# Patient Record
Sex: Female | Born: 1990 | Race: Asian | Hispanic: No | Marital: Single | Smoking: Never smoker
Health system: Southern US, Community
[De-identification: ages and names within clinical notes are randomized; demographics above are authoritative.]

## PROBLEM LIST (undated history)

## (undated) DIAGNOSIS — R7303 Prediabetes: Secondary | ICD-10-CM

## (undated) DIAGNOSIS — N939 Abnormal uterine and vaginal bleeding, unspecified: Secondary | ICD-10-CM

## (undated) DIAGNOSIS — E079 Disorder of thyroid, unspecified: Secondary | ICD-10-CM

## (undated) HISTORY — DX: Prediabetes: R73.03

## (undated) HISTORY — DX: Abnormal uterine and vaginal bleeding, unspecified: N93.9

## (undated) HISTORY — DX: Disorder of thyroid, unspecified: E07.9

---

## 2015-03-02 ENCOUNTER — Other Ambulatory Visit: Payer: Self-pay | Admitting: Obstetrics & Gynecology

## 2015-03-03 ENCOUNTER — Ambulatory Visit
Admission: RE | Admit: 2015-03-03 | Discharge: 2015-03-03 | Disposition: A | Discharge status: Home / Self Care | Payer: No Typology Code available for payment source | Source: Ambulatory Visit | Attending: Obstetrics & Gynecology | Admitting: Obstetrics & Gynecology

## 2015-03-03 ENCOUNTER — Other Ambulatory Visit: Payer: Self-pay | Admitting: Obstetrics & Gynecology

## 2015-03-03 DIAGNOSIS — Z9289 Personal history of other medical treatment: Secondary | ICD-10-CM

## 2015-03-03 DIAGNOSIS — R7611 Nonspecific reaction to tuberculin skin test without active tuberculosis: Secondary | ICD-10-CM | POA: Diagnosis present

## 2015-03-04 ENCOUNTER — Other Ambulatory Visit: Payer: Self-pay | Admitting: Obstetrics & Gynecology

## 2015-03-04 DIAGNOSIS — Z9289 Personal history of other medical treatment: Secondary | ICD-10-CM

## 2016-10-07 IMAGING — CR DG CHEST 2V
1 series · 2 of 2 positions shown · non-contrast
Comparison: None.

CLINICAL DATA: History of positive PPD

EXAM:
CHEST  2 VIEW

[Series 1: dg chest 2 view · 0.14mm/px · 2 of 2 slices shown]
[im 1/2]
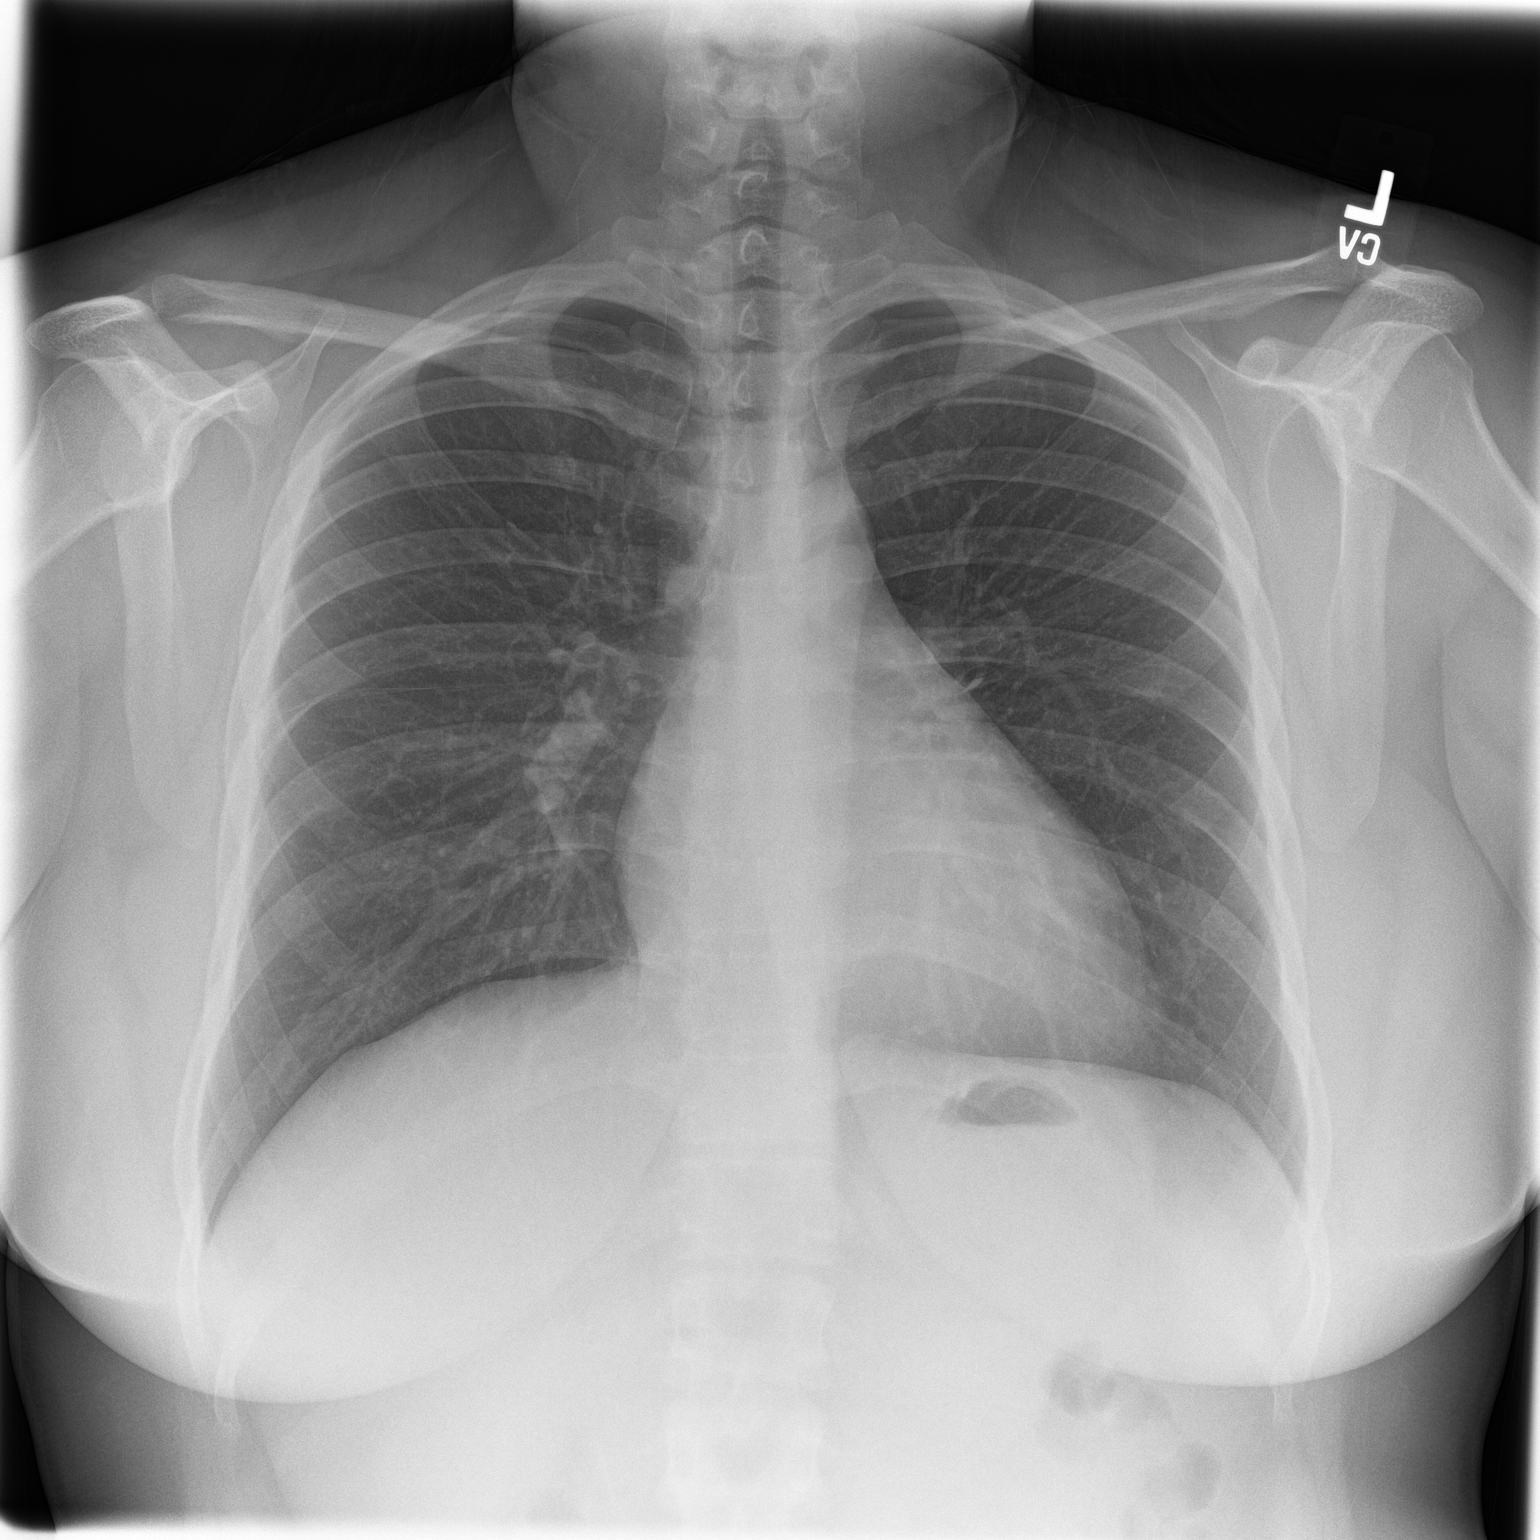
[im 2/2]
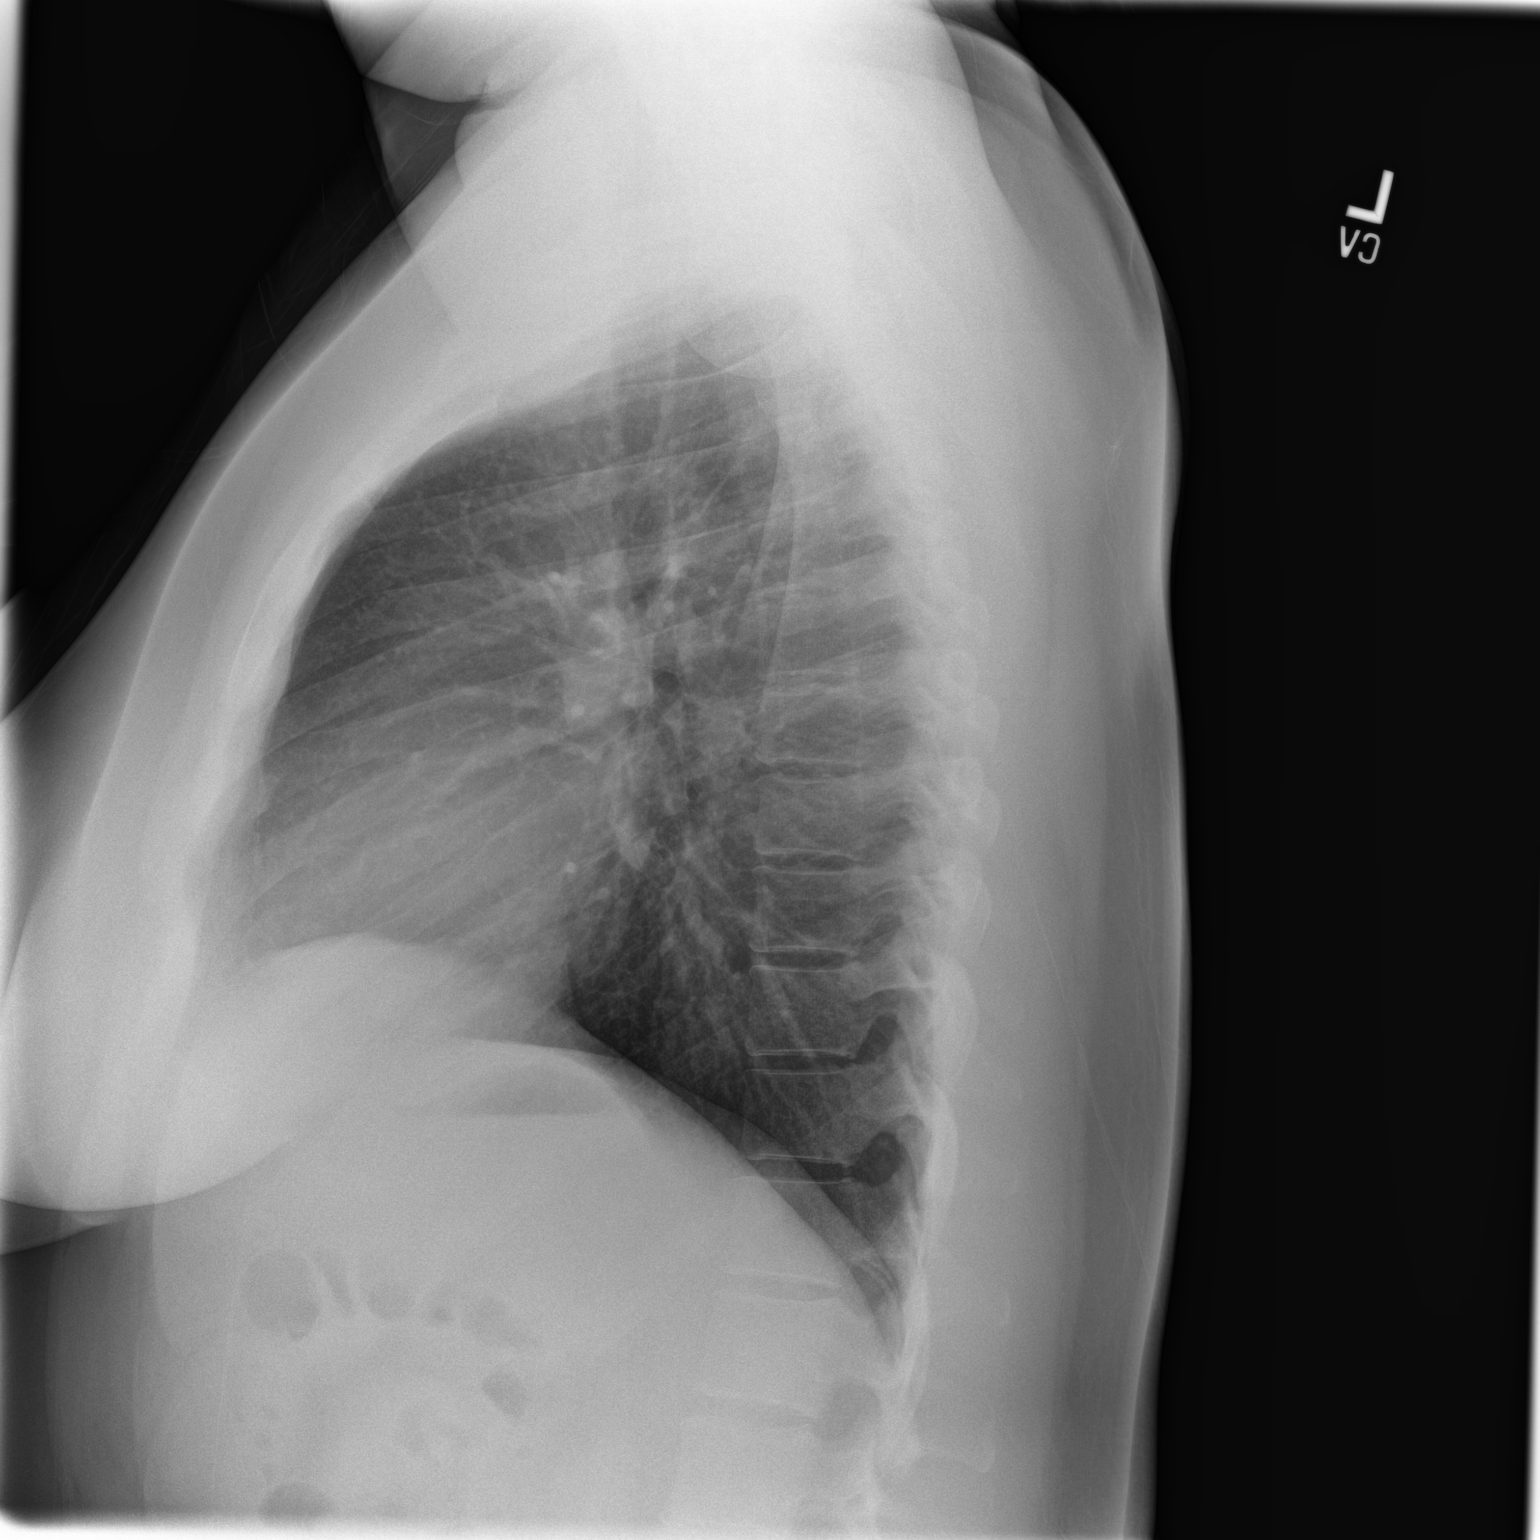

[2 of 2 positions shown; findings below may reference images not displayed]

FINDINGS: The heart size and mediastinal contours are within normal limits.
Both lungs are clear. The visualized skeletal structures are
unremarkable.
IMPRESSION: No active cardiopulmonary disease.

## 2016-10-25 ENCOUNTER — Encounter: Payer: Self-pay | Admitting: Obstetrics and Gynecology

## 2016-11-09 ENCOUNTER — Ambulatory Visit (INDEPENDENT_AMBULATORY_CARE_PROVIDER_SITE_OTHER): Payer: 59 | Admitting: Obstetrics and Gynecology

## 2016-11-09 ENCOUNTER — Encounter: Payer: Self-pay | Admitting: Obstetrics and Gynecology

## 2016-11-09 VITALS — BP 120/70 | HR 76 | Resp 16 | Ht 64.5 in | Wt 202.0 lb

## 2016-11-09 DIAGNOSIS — Z01419 Encounter for gynecological examination (general) (routine) without abnormal findings: Secondary | ICD-10-CM | POA: Diagnosis not present

## 2016-11-09 DIAGNOSIS — R7303 Prediabetes: Secondary | ICD-10-CM

## 2016-11-09 DIAGNOSIS — Z30431 Encounter for routine checking of intrauterine contraceptive device: Secondary | ICD-10-CM

## 2016-11-09 DIAGNOSIS — Z0000 Encounter for general adult medical examination without abnormal findings: Secondary | ICD-10-CM

## 2016-11-09 DIAGNOSIS — E039 Hypothyroidism, unspecified: Secondary | ICD-10-CM | POA: Diagnosis not present

## 2016-11-09 DIAGNOSIS — N939 Abnormal uterine and vaginal bleeding, unspecified: Secondary | ICD-10-CM | POA: Diagnosis not present

## 2016-11-09 NOTE — Progress Notes (Signed)
26 y.o. G0P0000 SingleAsianF here for annual exam.  She had a mirena IUD placed in 4/17. The patient had evaluation for irregular cycles in 2016 and was diagnosed with hypothyroidism. She was treated with synthroid, then she was having cycles every 2 weeks with heavy flow.  With the IUD she does have a cycle every month, changes a tampon in 3 hours (not always saturated). She can spot for 3 weeks at a time, when she cycles she won't bleed past 7 days.  [place] and she used the nuvaring for a few months, stopped it in May. Cramps are mild to none.  Not currently sexually active, has been in the past. She is on synthroid. She has lost about 10 lbs in the last 2 years. Not  Period Cycle (Days): 21 Period Duration (Days): unsure  Period Pattern: (!) Irregular Menstrual Flow: Light, Heavy Menstrual Control: Tampon Menstrual Control Change Freq (Hours): 4-24 Dysmenorrhea: (!) Mild Dysmenorrhea Symptoms: Other (Comment) (dizziness )  Patient's last menstrual period was 10/22/2016.          Sexually active: No.  The current method of family planning is IUD and abstinence.    Exercising: Yes.    swimming Smoker:  no  Health Maintenance: Pap:  11/24/14 Neg  History of abnormal Pap:  no MMG:  Never TDaP:  2008 Gardasil: completed    reports that she has never smoked. She has never used smokeless tobacco. She reports that she does not drink alcohol or use drugs.She just graduated from [employer], working at [employer]. She is from [place].   Past Medical History:  Diagnosis Date  . Abnormal uterine bleeding   . Thyroid disease    Hypo    History reviewed. No pertinent surgical history.  Current Outpatient Prescriptions  Medication Sig Dispense Refill  . levonorgestrel (MIRENA) 20 MCG/24HR IUD 1 each by Intrauterine route once. Placed 07/23/15    . levothyroxine (SYNTHROID, LEVOTHROID) 50 MCG tablet Take 1 tablet by mouth daily.     No current  facility-administered medications for this visit.     Family History  Problem Relation Age of Onset  . Breast cancer Maternal Aunt   . Cancer Maternal Grandfather   . Heart disease Paternal Grandfather   MAunt PMP with breast cancer  Review of Systems  Constitutional: Negative.   HENT: Negative.   Eyes: Negative.   Respiratory: Negative.   Cardiovascular: Negative.   Gastrointestinal: Negative.   Endocrine: Negative.   Genitourinary: Negative.   Musculoskeletal: Negative.   Skin: Negative.   Allergic/Immunologic: Negative.   Neurological: Negative.   Hematological: Negative.   Psychiatric/Behavioral: Negative.     Exam:   BP 120/70 (BP Location: Right Arm, Patient Position: Sitting, Cuff Size: Large)   Pulse 76   Resp 16   Ht 5' 4.5" (1.638 m)   Wt 202 lb (91.6 kg)   LMP 10/22/2016   BMI 34.14 kg/m   Weight change: @WEIGHTCHANGE @ Height:   Height: 5' 4.5" (163.8 cm)  Ht Readings from Last 3 Encounters:  11/09/16 5' 4.5" (1.638 m)    General appearance: alert, cooperative and appears stated age Head: Normocephalic, without obvious abnormality, atraumatic Neck: no adenopathy, supple, symmetrical, trachea midline and thyroid normal to inspection and palpation Lungs: clear to auscultation bilaterally Cardiovascular: regular rate and rhythm Breasts: normal appearance, no masses or tenderness Abdomen: soft, non-tender; bowel sounds normal; no masses,  no organomegaly Extremities: extremities normal, atraumatic, no cyanosis or edema  Skin: Skin color, texture, turgor normal. No rashes or lesions Lymph nodes: Cervical, supraclavicular, and axillary nodes normal. No abnormal inguinal nodes palpated Neurologic: Grossly normal   Pelvic: External genitalia:  no lesions              Urethra:  normal appearing urethra with no masses, tenderness or lesions              Bartholins and Skenes: normal                 Vagina: normal appearing vagina with normal color and  discharge, no lesions              Cervix: no lesions and IUD string 5 cm (patient states she was told it was 4 cm)               Bimanual Exam:  Uterus:  normal size, contour, position, consistency, mobility, non-tender              Adnexa: no mass, fullness, tenderness               Rectovaginal: Confirms               Anus:  normal sphincter tone, no lesions  Chaperone was present for exam.  A:  Well Woman with normal exam  Irregular bleeding with the Mirena, she will call if she is having constant spotting.   She had very abnormal bleeding prior to the mirena, but was just diagnosed with and started on Synthroid  Not currently sexually active  Hypothyroidism  Pre-diabetes    P:   Pap due next year  Screening labs  TSH, HgbA1C  Continue with the IUD for now  We discussed that if we pulled the IUD and her cycles were worse, her 2 best options would be another mirena, or OCP's/nuvaring (not good at remembering to take pills)  Discussed breast self awareness  Discussed calcium and vit D intake  If her TSH is normal, I will call in her synthroid, if not she will need to f/u with primary MD  Name of primary given

## 2016-11-09 NOTE — Patient Instructions (Signed)
EXERCISE AND DIET:  We recommended that you start or continue a regular exercise program for good health. Regular exercise means any activity that makes your heart beat faster and makes you sweat.  We recommend exercising at least 30 minutes per day at least 3 days a week, preferably 4 or 5.  We also recommend a diet low in fat and sugar.  Inactivity, poor dietary choices and obesity can cause diabetes, heart attack, stroke, and kidney damage, among others.    ALCOHOL AND SMOKING:  Women should limit their alcohol intake to no more than 7 drinks/beers/glasses of wine (combined, not each!) per week. Moderation of alcohol intake to this level decreases your risk of breast cancer and liver damage. And of course, no recreational drugs are part of a healthy lifestyle.  And absolutely no smoking or even second hand smoke. Most people know smoking can cause heart and lung diseases, but did you know it also contributes to weakening of your bones? Aging of your skin?  Yellowing of your teeth and nails?  CALCIUM AND VITAMIN D:  Adequate intake of calcium and Vitamin D are recommended.  The recommendations for exact amounts of these supplements seem to change often, but generally speaking 600 mg of calcium (either carbonate or citrate) and 800 units of Vitamin D per day seems prudent. Certain women may benefit from higher intake of Vitamin D.  If you are among these women, your doctor will have told you during your visit.    PAP SMEARS:  Pap smears, to check for cervical cancer or precancers,  have traditionally been done yearly, although recent scientific advances have shown that most women can have pap smears less often.  However, every woman still should have a physical exam from her gynecologist every year. It will include a breast check, inspection of the vulva and vagina to check for abnormal growths or skin changes, a visual exam of the cervix, and then an exam to evaluate the size and shape of the uterus and  ovaries.  And after 28 years of age, a rectal exam is indicated to check for rectal cancers. We will also provide age appropriate advice regarding health maintenance, like when you should have certain vaccines, screening for sexually transmitted diseases, bone density testing, colonoscopy, mammograms, etc.   MAMMOGRAMS:  All women over 40 years old should have a yearly mammogram. Many facilities now offer a "3D" mammogram, which may cost around $50 extra out of pocket. If possible,  we recommend you accept the option to have the 3D mammogram performed.  It both reduces the number of women who will be called back for extra views which then turn out to be normal, and it is better than the routine mammogram at detecting truly abnormal areas.    COLONOSCOPY:  Colonoscopy to screen for colon cancer is recommended for all women at age 50.  We know, you hate the idea of the prep.  We agree, BUT, having colon cancer and not knowing it is worse!!  Colon cancer so often starts as a polyp that can be seen and removed at colonscopy, which can quite literally save your life!  And if your first colonoscopy is normal and you have no family history of colon cancer, most women don't have to have it again for 10 years.  Once every ten years, you can do something that may end up saving your life, right?  We will be happy to help you get it scheduled when you are ready.    Be sure to check your insurance coverage so you understand how much it will cost.  It may be covered as a preventative service at no cost, but you should check your particular policy.      Breast Self-Awareness Breast self-awareness means being familiar with how your breasts look and feel. It involves checking your breasts regularly and reporting any changes to your health care provider. Practicing breast self-awareness is important. A change in your breasts can be a sign of a serious medical problem. Being familiar with how your breasts look and feel allows  you to find any problems early, when treatment is more likely to be successful. All women should practice breast self-awareness, including women who have had breast implants. How to do a breast self-exam One way to learn what is normal for your breasts and whether your breasts are changing is to do a breast self-exam. To do a breast self-exam: Look for Changes  1. Remove all the clothing above your waist. 2. Stand in front of a mirror in a room with good lighting. 3. Put your hands on your hips. 4. Push your hands firmly downward. 5. Compare your breasts in the mirror. Look for differences between them (asymmetry), such as: ? Differences in shape. ? Differences in size. ? Puckers, dips, and bumps in one breast and not the other. 6. Look at each breast for changes in your skin, such as: ? Redness. ? Scaly areas. 7. Look for changes in your nipples, such as: ? Discharge. ? Bleeding. ? Dimpling. ? Redness. ? A change in position. Feel for Changes  Carefully feel your breasts for lumps and changes. It is best to do this while lying on your back on the floor and again while sitting or standing in the shower or tub with soapy water on your skin. Feel each breast in the following way:  Place the arm on the side of the breast you are examining above your head.  Feel your breast with the other hand.  Start in the nipple area and make  inch (2 cm) overlapping circles to feel your breast. Use the pads of your three middle fingers to do this. Apply light pressure, then medium pressure, then firm pressure. The light pressure will allow you to feel the tissue closest to the skin. The medium pressure will allow you to feel the tissue that is a little deeper. The firm pressure will allow you to feel the tissue close to the ribs.  Continue the overlapping circles, moving downward over the breast until you feel your ribs below your breast.  Move one finger-width toward the center of the body.  Continue to use the  inch (2 cm) overlapping circles to feel your breast as you move slowly up toward your collarbone.  Continue the up and down exam using all three pressures until you reach your armpit.  Write Down What You Find  Write down what is normal for each breast and any changes that you find. Keep a written record with breast changes or normal findings for each breast. By writing this information down, you do not need to depend only on memory for size, tenderness, or location. Write down where you are in your menstrual cycle, if you are still menstruating. If you are having trouble noticing differences in your breasts, do not get discouraged. With time you will become more familiar with the variations in your breasts and more comfortable with the exam. How often should I examine my breasts? Examine   your breasts every month. If you are breastfeeding, the best time to examine your breasts is after a feeding or after using a breast pump. If you menstruate, the best time to examine your breasts is 5-7 days after your period is over. During your period, your breasts are lumpier, and it may be more difficult to notice changes. When should I see my health care provider? See your health care provider if you notice:  A change in shape or size of your breasts or nipples.  A change in the skin of your breast or nipples, such as a reddened or scaly area.  Unusual discharge from your nipples.  A lump or thick area that was not there before.  Pain in your breasts.  Anything that concerns you.  This information is not intended to replace advice given to you by your health care provider. Make sure you discuss any questions you have with your health care provider. Document Released: 04/03/2005 Document Revised: 09/09/2015 Document Reviewed: 02/21/2015 Elsevier Interactive Patient Education  2018 Elsevier Inc.  

## 2016-11-10 ENCOUNTER — Encounter: Payer: Self-pay | Admitting: Obstetrics and Gynecology

## 2016-11-10 LAB — HEMOGLOBIN A1C
Est. average glucose Bld gHb Est-mCnc: 111 mg/dL
HEMOGLOBIN A1C: 5.5 % (ref 4.8–5.6)

## 2016-11-10 LAB — CBC
HEMATOCRIT: 41 % (ref 34.0–46.6)
Hemoglobin: 13.2 g/dL (ref 11.1–15.9)
MCH: 27.9 pg (ref 26.6–33.0)
MCHC: 32.2 g/dL (ref 31.5–35.7)
MCV: 87 fL (ref 79–97)
PLATELETS: 365 10*3/uL (ref 150–379)
RBC: 4.73 x10E6/uL (ref 3.77–5.28)
RDW: 13.9 % (ref 12.3–15.4)
WBC: 6.5 10*3/uL (ref 3.4–10.8)

## 2016-11-10 LAB — COMPREHENSIVE METABOLIC PANEL
ALBUMIN: 4.4 g/dL (ref 3.5–5.5)
ALK PHOS: 92 IU/L (ref 39–117)
ALT: 34 IU/L — AB (ref 0–32)
AST: 21 IU/L (ref 0–40)
Albumin/Globulin Ratio: 1.5 (ref 1.2–2.2)
BILIRUBIN TOTAL: 0.3 mg/dL (ref 0.0–1.2)
BUN/Creatinine Ratio: 16 (ref 9–23)
BUN: 12 mg/dL (ref 6–20)
CHLORIDE: 102 mmol/L (ref 96–106)
CO2: 23 mmol/L (ref 20–29)
CREATININE: 0.74 mg/dL (ref 0.57–1.00)
Calcium: 9.5 mg/dL (ref 8.7–10.2)
GFR calc Af Amer: 127 mL/min/{1.73_m2} (ref >59)
GFR calc non Af Amer: 111 mL/min/{1.73_m2} (ref >59)
GLUCOSE: 94 mg/dL (ref 65–99)
Globulin, Total: 3 g/dL (ref 1.5–4.5)
Potassium: 4.5 mmol/L (ref 3.5–5.2)
Sodium: 141 mmol/L (ref 134–144)
TOTAL PROTEIN: 7.4 g/dL (ref 6.0–8.5)

## 2016-11-10 LAB — LIPID PANEL
CHOL/HDL RATIO: 3.8 ratio (ref 0.0–4.4)
Cholesterol, Total: 199 mg/dL (ref 100–199)
HDL: 53 mg/dL (ref >39)
LDL CALC: 110 mg/dL — AB (ref 0–99)
TRIGLYCERIDES: 182 mg/dL — AB (ref 0–149)
VLDL Cholesterol Cal: 36 mg/dL (ref 5–40)

## 2016-11-10 LAB — TSH: TSH: 2.55 u[IU]/mL (ref 0.450–4.500)

## 2016-11-12 ENCOUNTER — Encounter: Payer: Self-pay | Admitting: Obstetrics and Gynecology

## 2016-11-14 ENCOUNTER — Telehealth: Payer: Self-pay | Admitting: *Deleted

## 2016-11-14 NOTE — Telephone Encounter (Signed)
Left message to call Cheryl LarssonJill at [phone].   From Cheryl Carr To Cheryl BolkJill Evelyn Jertson, Cheryl Carr Sent 11/12/2016 6:14 PM  Sorry for the multiple emails. I realized that Dr [name]'s note said she left 2-3cm of the string from the Mirena. Is there a possibility that my AUB could be because the Mirena moved? Thank you.     From Cheryl Carr To Cheryl BolkJill Evelyn Jertson, Cheryl Carr Sent 11/10/2016 3:08 PM  Cheryl Carr,   Thank you for seeing me yesterday. I am having regular cycle bleeding today. I didn't know if you'd recommend to wait it out, or possibly a nuvaring to see if it'll stop the bleeding? I also forgot to mention yesterday, I do get acne with my cycles, it'll go the length of my cycle. I understand acne may also be due to my IUD. Just wanted to get your opinion on if there is a way to decrease it.   Thank you, I appreciate it.   Cheryl Carr

## 2016-11-14 NOTE — Telephone Encounter (Signed)
See telephone encounter dated 11/14/16.

## 2016-11-14 NOTE — Telephone Encounter (Signed)
Spoke with patient. Reports cycle is still heavy and going now for 4 weeks.   1.Discussed OCP vs Nuvaring in addition to Mirena at last OV dated 11/09/16. Patient would like to know of this can be tried or wait?   2. Options for cyclic acne?  3. States Dr. Elesa MassedWard notes states 2-3 cm of string left from Mirena, Dr. Oscar LaJertson notes 5cm, could the IUD have moved?  Advised patient would review with Dr. Oscar LaJertson and return call with recommendations, patient is agreeable.  Dr. Oscar LaJertson -please advise?

## 2016-11-15 ENCOUNTER — Other Ambulatory Visit: Payer: Self-pay | Admitting: Obstetrics and Gynecology

## 2016-11-15 DIAGNOSIS — E785 Hyperlipidemia, unspecified: Secondary | ICD-10-CM

## 2016-11-15 DIAGNOSIS — R748 Abnormal levels of other serum enzymes: Secondary | ICD-10-CM

## 2016-11-15 MED ORDER — LEVOTHYROXINE SODIUM 50 MCG PO TABS
50.0000 ug | ORAL_TABLET | Freq: Every day | ORAL | 3 refills | Status: DC
Start: 2016-11-15 — End: 2018-09-11

## 2016-11-16 NOTE — Telephone Encounter (Signed)
Spoke with patient, advised as seen below per Dr. Oscar LaJertson.   1. Can I try Yaz for 1 month to see how cycle regulates? Is aware usually takes 3 months, unsure of how OCP regulates as a second contraceptive? Does not want to use OCP long term.  2. If IUD shows incorrect placement, are you agreeable to removing and replacing same day?   Advised patient will review with Dr. Oscar LaJertson and return call.  Dr. Oscar LaJertson, please advise?

## 2016-11-16 NOTE — Telephone Encounter (Signed)
The yaz is to regulate her bleeding for now and help her acne. She doesn't need to start it if she doesn't want to. I can definitely pull the IUD the same day as the ultrasound.  We would have to get her pre-certified to replace the IUD the same day as well. If that's what she wants to do, please have Suzy pre-certify (I can do that in the 30 min appointment slot) Another option is to pull the IUD and see how she does. Her bleeding got irregular when her thyroid was out of control and now it's normal.

## 2016-11-16 NOTE — Telephone Encounter (Signed)
We can start her on either OCP's or the nuvaring if she would like. Her ALT was just barely elevated, the nuvaring would be a good choice because it wouldn't affect her liver. Dianah FieldYaz would be better for acne, the yaz may slightly increase her risk of a blood clot over nuvaring or another OCP. Given the minimal increase in her ALT, I would be okay with the Yaz if she wants to try that. We can set her up for an ultrasound (the week of 8/14) to check for IUD placement to see if that is part of the problem, or we can pull the IUD if she prefers. Please set her up for either appointment.  I would recommend she either start the yaz or nuvaring today. She wasn't anemic last week, so I'm not worried about her from the perspective of the continued bleeding.  If there is any chance of pregnancy she should check a UPT.  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

## 2016-11-17 MED ORDER — DROSPIRENONE-ETHINYL ESTRADIOL 3-0.02 MG PO TABS: 1.0000 | ORAL_TABLET | Freq: Every day | ORAL | 2 refills | Status: DC

## 2016-11-17 NOTE — Telephone Encounter (Signed)
Left message to call Maryalyce Sanjuan at [phone].  

## 2016-11-17 NOTE — Telephone Encounter (Signed)
Spoke with patient, advised as seen below per Dr. Oscar LaJertson. Patient would like to start with Yaz first and monitor cycle. Patient aware to call with any questions/concerns. Verbalizes understanding and is agreeable.   Rx for Yaz #1/2RF to verified pharmacy on file.   Routing to provider for final review. Patient is agreeable to disposition. Will close encounter.

## 2016-11-17 NOTE — Telephone Encounter (Signed)
Patient returning your call.

## 2016-11-21 ENCOUNTER — Telehealth: Payer: Self-pay | Admitting: *Deleted

## 2016-11-21 DIAGNOSIS — N939 Abnormal uterine and vaginal bleeding, unspecified: Secondary | ICD-10-CM

## 2016-11-21 DIAGNOSIS — Z30431 Encounter for routine checking of intrauterine contraceptive device: Secondary | ICD-10-CM

## 2016-11-21 NOTE — Telephone Encounter (Signed)
[facility] that Dianah FieldYaz is not covered under patient's insurance plan until she has a trial of two NON-DROSPIRENONE containing oral contraceptives.  Please advise if change is appropriate. Thank you.  Routed to Dr. Edward JollySilva in Dr. Salli QuarryJertson's absence.

## 2016-11-22 NOTE — Telephone Encounter (Signed)
Triage, please initiate a prior authorization process for the Yaz.  This was specifically chosen for added benefit of treating acne.   Cc- Francee PiccoloStephanie Phillips

## 2016-11-23 NOTE — Telephone Encounter (Signed)
PA for Yaz submitted via [email or web address]

## 2016-11-27 ENCOUNTER — Encounter: Payer: Self-pay | Admitting: Obstetrics and Gynecology

## 2016-11-28 MED ORDER — NORGESTIM-ETH ESTRAD TRIPHASIC 0.18/0.215/0.25 MG-25 MCG PO TABS: 1.0000 | ORAL_TABLET | Freq: Every day | ORAL | 0 refills | Status: DC

## 2016-11-28 MED FILL — TRI-LO-MARZIA TABLET: 0.18/0.215/ | 84 days supply | Qty: 84 | Fill #0

## 2016-11-28 NOTE — Telephone Encounter (Signed)
Medication refill request: OCP change  Last AEX:  11/09/16 JJ Next AEX: none  Last MMG (if hormonal medication request): none Refill authorized: yaz sent 11/17/16 #1pack/2R.   Today please advise.

## 2016-11-28 NOTE — Telephone Encounter (Signed)
She has tried the nuvaring, have her try ortho-tri cyclen lo for 3 months. Please also set her up for an ultrasound to check her IUD. Her string was longer than expected at her last exam. We are trying to stop the irregular bleeding with the IUD and need to make sure it is positioned properly.

## 2016-11-28 NOTE — Telephone Encounter (Signed)
Spoke with patient. Advised of message as seen below from Dr.Jertson. Patient verbalizes understanding. Rx for Ortho Tri Cyclen Lo take 1 tablet daily #3 0RF sent to pharmacy on file. PUS scheduled for 11/30/16 at 1 pm with 1:30 pm consult with Dr.Jertson. Order placed for precert.  Routing to provider for final review. Patient agreeable to disposition. Will close encounter.

## 2016-11-28 NOTE — Telephone Encounter (Signed)
PA received from [employer]. PA for Dianah FieldYaz has been denied. Per insurance the patient must try 2 no drospirenone containing oral contraceptions before this medication can be approved.

## 2016-11-29 ENCOUNTER — Telehealth: Payer: Self-pay | Admitting: Obstetrics and Gynecology

## 2016-11-29 NOTE — Telephone Encounter (Signed)
Spoke with patient. Advised patient of need to keep PUS appointment to ensure proper placement of IUD. Advised it is not recommended to postpone exam due to AUB and the importance of evaluating IUD placement in uterus. Patient is agreeable and will keep appointment as scheduled for 11/30/16.  Routing to provider for final review. Patient agreeable to disposition. Will close encounter.

## 2016-11-29 NOTE — Telephone Encounter (Signed)
Call to patient to review benefit for ultrasound scheduled 11-30-16 with Dr. Oscar LaJertson. Patient has concerns and wishes to speak with Dr. Oscar LaJertson or a nurse prior to proceeding with appointment.  Patient would like to discuss staying on OCP for an amount of time prior to proceeding with ultrasound. Patient remains scheduled until she speaks with a nurse.  Routing to triage with high priority based on appointment date.

## 2016-11-29 NOTE — Telephone Encounter (Signed)
Left message to call Amier Hoyt at [phone].  

## 2016-11-29 NOTE — Telephone Encounter (Signed)
Patient returned call to Jill. °

## 2016-11-30 ENCOUNTER — Ambulatory Visit (INDEPENDENT_AMBULATORY_CARE_PROVIDER_SITE_OTHER): Payer: 59 | Admitting: Obstetrics and Gynecology

## 2016-11-30 ENCOUNTER — Encounter: Payer: Self-pay | Admitting: Obstetrics and Gynecology

## 2016-11-30 ENCOUNTER — Ambulatory Visit (INDEPENDENT_AMBULATORY_CARE_PROVIDER_SITE_OTHER): Payer: 59

## 2016-11-30 VITALS — BP 102/60 | HR 72 | Resp 16 | Wt 203.0 lb

## 2016-11-30 DIAGNOSIS — Z30432 Encounter for removal of intrauterine contraceptive device: Secondary | ICD-10-CM | POA: Diagnosis not present

## 2016-11-30 DIAGNOSIS — N939 Abnormal uterine and vaginal bleeding, unspecified: Secondary | ICD-10-CM

## 2016-11-30 DIAGNOSIS — T8332XA Displacement of intrauterine contraceptive device, initial encounter: Secondary | ICD-10-CM | POA: Diagnosis not present

## 2016-11-30 DIAGNOSIS — Z30431 Encounter for routine checking of intrauterine contraceptive device: Secondary | ICD-10-CM | POA: Diagnosis not present

## 2016-11-30 NOTE — Progress Notes (Signed)
GYNECOLOGY  VISIT   HPI: 26 y.o.   Single  Asian  female   G0P0000 with Patient's last menstrual period was 10/29/2016.   here for follow up abnormal bleeding and IUD check. The patient had a mirena IUD placed at another office in 4/17. She has been having issues with irregular bleeding with the IUD and strings were noted to be long on exam. When she originally had the IUD placed she was having abnormal bleeding, but had just diagnosed with hypothyroidism. Her thyroid is now under control and she is not sexually active currently.    GYNECOLOGIC HISTORY: Patient's last menstrual period was 10/29/2016. Contraception:IUD/ OCP Menopausal hormone therapy: none         OB History    Gravida Para Term Preterm AB Living   0 0 0 0 0 0   SAB TAB Ectopic Multiple Live Births   0 0 0 0 0         There are no active problems to display for this patient.   Past Medical History:  Diagnosis Date  . Abnormal uterine bleeding   . Thyroid disease    Hypo    No past surgical history on file.  Current Outpatient Prescriptions  Medication Sig Dispense Refill  . levonorgestrel (MIRENA) 20 MCG/24HR IUD 1 each by Intrauterine route once. Placed 07/23/15    . levothyroxine (SYNTHROID, LEVOTHROID) 50 MCG tablet Take 1 tablet (50 mcg total) by mouth daily. 90 tablet 3  . Norgestimate-Ethinyl Estradiol Triphasic (ORTHO TRI-CYCLEN LO) 0.18/0.215/0.25 MG-25 MCG tab Take 1 tablet by mouth daily. 3 Package 0   No current facility-administered medications for this visit.      ALLERGIES: Patient has no known allergies.  Family History  Problem Relation Age of Onset  . Breast cancer Maternal Aunt   . Cancer Maternal Grandfather   . Heart disease Paternal Grandfather     Social History   Social History  . Marital status: Single    Spouse name: N/A  . Number of children: N/A  . Years of education: N/A   Occupational History  . Not on file.   Social History Main Topics  . Smoking  status: Never Smoker  . Smokeless tobacco: Never Used  . Alcohol use No  . Drug use: No  . Sexual activity: No     Comment: Mirena placed 07/23/15   Other Topics Concern  . Not on file   Social History Narrative  . No narrative on file    Review of Systems  Constitutional: Negative.   HENT: Negative.   Eyes: Negative.   Respiratory: Negative.   Cardiovascular: Negative.   Gastrointestinal: Negative.   Genitourinary:       Abnormal uterine bleeding   Musculoskeletal: Negative.   Skin: Negative.   Neurological: Negative.   Endo/Heme/Allergies: Negative.   Psychiatric/Behavioral: Negative.     IUD not in place on ultrasound, IUD noted in her LUS. Images reviewed with the patient  PHYSICAL EXAMINATION:    BP 102/60 (BP Location: Right Arm, Patient Position: Sitting, Cuff Size: Normal)   Pulse 72   Resp 16   Wt 203 lb (92.1 kg)   LMP 10/29/2016   BMI 34.31 kg/m     General appearance: alert, cooperative and appears stated age  Pelvic: External genitalia:  no lesions              Urethra:  normal appearing urethra with no masses, tenderness or lesions  Bartholins and Skenes: normal                 Vagina: normal appearing vagina with normal color and discharge, no lesions              Cervix:IUD string 5 cm. IUD removed with ringed forceps  Chaperone was present for exam.    ASSESSMENT Abnormal bleeding with the mirena IUD IUD not in place on ultrasound    PLAN IUD removed She will continue with current pack of pills. Not currently sexually active, may stop OCP's after this pack She will calendar bleeding and let me know if she continues to have issues   An After Visit Summary was printed and given to the patient.  10 minutes face to face time of which over 50% was spent in counseling.

## 2016-12-27 MED FILL — LEVOTHYROXINE 50 MCG TABLET: 50 | 90 days supply | Qty: 90 | Fill #0

## 2017-02-01 ENCOUNTER — Other Ambulatory Visit: Payer: Self-pay | Admitting: Obstetrics and Gynecology

## 2017-02-01 MED ORDER — NORGESTIM-ETH ESTRAD TRIPHASIC 0.18/0.215/0.25 MG-25 MCG PO TABS: 1.0000 | ORAL_TABLET | Freq: Every day | ORAL | 0 refills | Status: DC

## 2017-02-01 NOTE — Telephone Encounter (Signed)
I would like to have the patient f/u, she was having abnormal bleeding prior to starting on the pill. Please set her up for a pill check. One pack sent in.

## 2017-02-01 NOTE — Telephone Encounter (Signed)
Medication refill request: OCP Last AEX:  11/09/16 JJ Next AEX: none Last MMG (if hormonal medication request): none Refill authorized: 11/28/16 #[id number].

## 2017-02-02 ENCOUNTER — Telehealth: Payer: Self-pay | Admitting: Obstetrics and Gynecology

## 2017-02-02 NOTE — Telephone Encounter (Signed)
Spoke with patient in regards to refill request for OCP -see encounter dated 02/01/17. Advised patient of recommendations per Dr. Oscar LaJertson. Advised refill of OCP #1/[facility]RF [facility].  Patient states she only plans to stay on OCP until December, is f/u still recommended? Advised patient f/u recommended for evaluation and future refills. Patient scheduled for OV on 02/08/17 at 1:45pm with Dr. Reyne DumasJerston. Advised Dr. Oscar LaJertson will review, will return call with any additional recommendations. Patient verbalizes understanding and is agreeable.   Routing to provider for final review. Patient is agreeable to disposition. Will close encounter.   Cc: Gara Kronereina Morales, CMA

## 2017-02-02 NOTE — Telephone Encounter (Signed)
Patient called requesting to speak with Barbee CoughReina about a refill request message she received from [facility]. Transferring call to triage [facility].

## 2017-02-02 NOTE — Telephone Encounter (Signed)
Left voicemail to call back to schedule f/u appt.

## 2017-02-06 NOTE — Addendum Note (Signed)
Addended by: Tobi BastosJERTSON, Skilar Marcou E on: 02/06/2017 04:46 PM   Modules accepted: Orders

## 2017-02-06 NOTE — Telephone Encounter (Signed)
If she only wants one more refill, then please send that in for her. Otherwise she should f/u.

## 2017-02-06 NOTE — Telephone Encounter (Signed)
Spoke with patient, advised as seen below per Dr. Oscar LaJertson. Patient states she would need at least 2 more refills, will keep appointment as scheduled. Patient verbalizes understanding and is agreeable.   Routing to provider for final review. Patient is agreeable to disposition. Will close encounter.

## 2017-02-08 ENCOUNTER — Encounter: Payer: Self-pay | Admitting: Obstetrics and Gynecology

## 2017-02-08 ENCOUNTER — Ambulatory Visit (INDEPENDENT_AMBULATORY_CARE_PROVIDER_SITE_OTHER): Payer: 59 | Admitting: Obstetrics and Gynecology

## 2017-02-08 VITALS — BP 116/72 | HR 80 | Resp 16 | Wt 204.0 lb

## 2017-02-08 DIAGNOSIS — Z3041 Encounter for surveillance of contraceptive pills: Secondary | ICD-10-CM

## 2017-02-08 MED ORDER — NORGESTIM-ETH ESTRAD TRIPHASIC 0.18/0.215/0.25 MG-25 MCG PO TABS: 1.0000 | ORAL_TABLET | Freq: Every day | ORAL | 2 refills | Status: DC

## 2017-02-08 MED FILL — TRI-LO-MARZIA TABLET: 0.18/0.215/ | 84 days supply | Qty: 84 | Fill #0

## 2017-02-08 NOTE — Progress Notes (Signed)
GYNECOLOGY  VISIT   HPI: 26 y.o.   Single  Asian  female   G0P0000 with Patient's last menstrual period was 01/20/2017.   here for 3 month OCP f/u. She had a malpositioned IUD removed in 8/16. She was on contraception for cycle control, her thyroid was not normal. Her thyroid is now in a normal range. She has been on OCP's since prior to her IUD was removed. Since removing the IUD she is having a monthly cycle x 4 days. Saturating an ultra tampon in 3-4 hours. Spotted the first month after the IUD was removed not the last cycle. She is interested in trying to go off the pill. Not currently sexually active. She wants to stay on the pill at least for the last few months.   GYNECOLOGIC HISTORY: Patient's last menstrual period was 01/20/2017. Contraception:OCP Menopausal hormone therapy: n/a        OB History    Gravida Para Term Preterm AB Living   0 0 0 0 0 0   SAB TAB Ectopic Multiple Live Births   0 0 0 0 0         There are no active problems to display for this patient.   Past Medical History:  Diagnosis Date  . Abnormal uterine bleeding   . Thyroid disease    Hypo    History reviewed. No pertinent surgical history.  Current Outpatient Prescriptions  Medication Sig Dispense Refill  . levothyroxine (SYNTHROID, LEVOTHROID) 50 MCG tablet Take 1 tablet (50 mcg total) by mouth daily. 90 tablet 3  . Norgestimate-Ethinyl Estradiol Triphasic (ORTHO TRI-CYCLEN LO) 0.18/0.215/0.25 MG-25 MCG tab Take 1 tablet by mouth daily. 1 Package 0   No current facility-administered medications for this visit.      ALLERGIES: Patient has no known allergies.  Family History  Problem Relation Age of Onset  . Breast cancer Maternal Aunt   . Cancer Maternal Grandfather   . Heart disease Paternal Grandfather     Social History   Social History  . Marital status: Single    Spouse name: N/A  . Number of children: N/A  . Years of education: N/A   Occupational History  . Not on file.    Social History Main Topics  . Smoking status: Never Smoker  . Smokeless tobacco: Never Used  . Alcohol use No  . Drug use: No  . Sexual activity: No     Comment: Mirena placed 07/23/15   Other Topics Concern  . Not on file   Social History Narrative  . No narrative on file    Review of Systems  Constitutional: Negative.   HENT: Negative.   Eyes: Negative.   Respiratory: Negative.   Cardiovascular: Negative.   Gastrointestinal: Negative.   Genitourinary: Negative.   Musculoskeletal: Negative.   Skin: Negative.   Neurological: Negative.   Endo/Heme/Allergies: Negative.   Psychiatric/Behavioral: Negative.     PHYSICAL EXAMINATION:    BP 116/72 (BP Location: Right Arm, Patient Position: Sitting, Cuff Size: Large)   Pulse 80   Resp 16   Wt 204 lb (92.5 kg)   LMP 01/20/2017   BMI 34.48 kg/m     General appearance: alert, cooperative and appears stated age  ASSESSMENT Doing well on OCP's    PLAN Will refill the pills thorough July, she may do a trial of. Annual exam in 7/18   An After Visit Summary was printed and given to the patient.

## 2017-03-02 ENCOUNTER — Other Ambulatory Visit: Payer: Self-pay | Admitting: Obstetrics and Gynecology

## 2017-03-02 ENCOUNTER — Telehealth: Payer: Self-pay | Admitting: *Deleted

## 2017-03-02 NOTE — Progress Notes (Signed)
See telephone encounter dated 03/02/17.

## 2017-03-02 NOTE — Progress Notes (Signed)
An order was sent on 10/25. Can you please see what happened and make sure she has her medication.

## 2017-03-02 NOTE — Progress Notes (Addendum)
Routing to Dr.Jertson. Will close encounter. 

## 2017-03-02 NOTE — Telephone Encounter (Signed)
    Romualdo BolkJertson, Desta Bujak Evelyn, MD at 03/02/2017 12:36 PM   An order was sent on 10/25. Can you please see what happened and make sure she has her medication.

## 2017-03-02 NOTE — Telephone Encounter (Signed)
Spoke with patient. Patient states she has received her RX for OCP, picked up day after OV. No f/u needed.   Routing to provider for final review. Patient is agreeable to disposition. Will close encounter.

## 2017-04-23 ENCOUNTER — Telehealth: Payer: Self-pay | Admitting: *Deleted

## 2017-04-23 NOTE — Telephone Encounter (Signed)
-----   Message from Romualdo BolkJill Evelyn Jertson, MD sent at 04/20/2017 12:56 PM EST ----- Please call the patient, remind her she is overdue for f/u fasting blood work. Thanks, Noreene LarssonJill ----- Message ----- From: SYSTEM Sent: 03/22/2017  12:06 AM To: Romualdo BolkJill Evelyn Jertson, MD

## 2017-04-23 NOTE — Telephone Encounter (Signed)
Left message to call regarding fasting labs appointment-eh

## 2017-05-07 NOTE — Telephone Encounter (Signed)
Left another message to call and schedule fasting labs -eh

## 2017-05-30 ENCOUNTER — Telehealth: Payer: Self-pay | Admitting: Obstetrics and Gynecology

## 2017-05-30 NOTE — Telephone Encounter (Signed)
Left message to call Cheryl LarssonJill at [phone].   11/09/16, f/u 3 months. Over due for f/u hepatic function panel for elevated liver enzymes.

## 2017-05-30 NOTE — Telephone Encounter (Signed)
Spoke with patient. Patient states she is establishing care with PCP, asking if f/u labs can be drawn by PCP. Advised patient due for f/u hepatic function panel for elevated liver enzymes, can have labs drawn with PCP. Request copy of labs be sent to Dr. Oscar LaJertson once completed. Patient verbalizes understanding and is agreeable.   Routing to provider for final review. Patient is agreeable to disposition. Will close encounter.

## 2017-05-30 NOTE — Telephone Encounter (Signed)
Patient called requesting to speak with the nurse. She is getting established with a PCP and wants to confirm she can have any needed labs there instead of here.

## 2017-06-08 DIAGNOSIS — E039 Hypothyroidism, unspecified: Secondary | ICD-10-CM | POA: Diagnosis not present

## 2017-06-08 DIAGNOSIS — Z23 Encounter for immunization: Secondary | ICD-10-CM | POA: Diagnosis not present

## 2017-06-11 ENCOUNTER — Ambulatory Visit: Payer: No Typology Code available for payment source | Admitting: Nurse Practitioner

## 2017-08-15 MED FILL — NORG-EE 0.18-0.215-0.25/0.0: 0.18/0.215/ | 84 days supply | Qty: 84 | Fill #1

## 2017-09-19 ENCOUNTER — Telehealth: Payer: Self-pay | Admitting: Obstetrics and Gynecology

## 2017-09-19 DIAGNOSIS — T8332XA Displacement of intrauterine contraceptive device, initial encounter: Secondary | ICD-10-CM

## 2017-09-19 DIAGNOSIS — Z30014 Encounter for initial prescription of intrauterine contraceptive device: Secondary | ICD-10-CM

## 2017-09-19 NOTE — Telephone Encounter (Signed)
I think she can absolutely have another IUD. I would offer her the option of inserting the IUD with u/s guidance since her last IUD was malpositioned.

## 2017-09-19 NOTE — Telephone Encounter (Signed)
Spoke with patient. Currently on OCP for contraceptive. Stopped OCP because she  experiences increased emotion 1 wk prior to menses, restarted 2 mo ago, symptoms resolved.   [facility] 11/30/16 d/t malposition, asking if mirena IUD can be placed again?    Advised will review with Dr. Oscar LaJertson and return call with recommendations. Last AEX 11/09/16, next 11/28/17.   Dr. Oscar LaJertson -please advise on IUD.

## 2017-09-19 NOTE — Telephone Encounter (Signed)
Patient called and stated that she previously had an IUD taken out due to misplacement. Patient would like to have it reinserted if possible.

## 2017-09-19 NOTE — Telephone Encounter (Signed)
Left message to call Field Staniszewski at [phone].  

## 2017-09-20 NOTE — Telephone Encounter (Signed)
Patient returning Jill's call.  °

## 2017-09-20 NOTE — Telephone Encounter (Signed)
Spoke with patient, advised as seen below per Dr. Oscar LaJertson. OCP for contraceptive. US guided IUD insertion scheduled for 10/25/17 at 9am with Dr. Oscar LaJertson.   Advised patient to continue OCP until IUD inserted, Motrin 800 mg with food and water one hour before procedure. Will be called with benefits prior to procedure.   Order placed for PUS and Mirena IUD insertion  Routing to [place] NickelSuzy D and [place] Bay[place]osa D for [employer].  Will close encounter.

## 2017-09-21 DIAGNOSIS — H52223 Regular astigmatism, bilateral: Secondary | ICD-10-CM | POA: Diagnosis not present

## 2017-09-21 DIAGNOSIS — H5213 Myopia, bilateral: Secondary | ICD-10-CM | POA: Diagnosis not present

## 2017-10-25 ENCOUNTER — Ambulatory Visit (INDEPENDENT_AMBULATORY_CARE_PROVIDER_SITE_OTHER): Payer: 59 | Admitting: Obstetrics and Gynecology

## 2017-10-25 ENCOUNTER — Other Ambulatory Visit: Payer: Self-pay

## 2017-10-25 ENCOUNTER — Encounter: Payer: Self-pay | Admitting: Obstetrics and Gynecology

## 2017-10-25 ENCOUNTER — Ambulatory Visit (INDEPENDENT_AMBULATORY_CARE_PROVIDER_SITE_OTHER): Payer: 59

## 2017-10-25 VITALS — BP 124/81 | HR 64 | Wt 217.0 lb

## 2017-10-25 DIAGNOSIS — Z30014 Encounter for initial prescription of intrauterine contraceptive device: Secondary | ICD-10-CM

## 2017-10-25 DIAGNOSIS — Z01812 Encounter for preprocedural laboratory examination: Secondary | ICD-10-CM

## 2017-10-25 LAB — POCT URINE PREGNANCY: Preg Test, Ur: NEGATIVE

## 2017-10-25 NOTE — Patient Instructions (Signed)
IUD Post-procedure Instructions . Cramping is common.  You may take Ibuprofen, Aleve, or Tylenol for the cramping.  This should resolve within 24 hours.   . You may have a small amount of spotting.  You should wear a mini pad for the next few days. . You may have intercourse in 24 hours. . You need to call the office if you have any pelvic pain, fever, heavy bleeding, or foul smelling vaginal discharge. . Shower or bathe as normal . Use a back up contraception for one week  

## 2017-10-25 NOTE — Progress Notes (Signed)
GYNECOLOGY  VISIT   HPI: 27 y.o.   Single  Asian  female   G0P0000 with Patient's last menstrual period was 10/05/2017 (exact date).   here for mirena IUD insertion. Previously IUD was malpositioned    GYNECOLOGIC HISTORY: Patient's last menstrual period was 10/05/2017 (exact date). Contraception:OCP's Menopausal hormone therapy: NA        OB History    Gravida  0   Para  0   Term  0   Preterm  0   AB  0   Living  0     SAB  0   TAB  0   Ectopic  0   Multiple  0   Live Births  0              There are no active problems to display for this patient.   Past Medical History:  Diagnosis Date  . Abnormal uterine bleeding   . Thyroid disease    Hypo    History reviewed. No pertinent surgical history.  Current Outpatient Medications  Medication Sig Dispense Refill  . levothyroxine (SYNTHROID, LEVOTHROID) 50 MCG tablet Take 1 tablet (50 mcg total) by mouth daily. 90 tablet 3  . Norgestimate-Ethinyl Estradiol Triphasic (ORTHO TRI-CYCLEN LO) 0.18/0.215/0.25 MG-25 MCG tab Take 1 tablet by mouth daily. 3 Package 2   No current facility-administered medications for this visit.      ALLERGIES: Patient has no known allergies.  Family History  Problem Relation Age of Onset  . Breast cancer Maternal Aunt   . Cancer Maternal Grandfather   . Heart disease Paternal Grandfather     Social History   Socioeconomic History  . Marital status: Single    Spouse name: Not on file  . Number of children: Not on file  . Years of education: Not on file  . Highest education level: Not on file  Occupational History  . Not on file  Social Needs  . Financial resource strain: Not on file  . Food insecurity:    Worry: Not on file    Inability: Not on file  . Transportation needs:    Medical: Not on file    Non-medical: Not on file  Tobacco Use  . Smoking status: Never Smoker  . Smokeless tobacco: Never Used  Substance and Sexual Activity  . Alcohol use: No  .  Drug use: No  . Sexual activity: Never    Birth control/protection: IUD, Abstinence    Comment: Mirena placed 07/23/15  Lifestyle  . Physical activity:    Days per week: Not on file    Minutes per session: Not on file  . Stress: Not on file  Relationships  . Social connections:    Talks on phone: Not on file    Gets together: Not on file    Attends religious service: Not on file    Active member of club or organization: Not on file    Attends meetings of clubs or organizations: Not on file    Relationship status: Not on file  . Intimate partner violence:    Fear of current or ex partner: Not on file    Emotionally abused: Not on file    Physically abused: Not on file    Forced sexual activity: Not on file  Other Topics Concern  . Not on file  Social History Narrative  . Not on file    Review of Systems  Constitutional: Negative.   HENT: Negative.   Eyes: Negative.  Respiratory: Negative.   Cardiovascular: Negative.   Gastrointestinal: Negative.   Genitourinary: Negative.   Musculoskeletal: Negative.   Skin: Negative.   Neurological: Negative.   Endo/Heme/Allergies: Negative.   Psychiatric/Behavioral: Negative.     PHYSICAL EXAMINATION:    BP 124/81 (BP Location: Right Arm, Patient Position: Sitting)   Pulse 64   Wt 217 lb (98.4 kg)   LMP 10/05/2017 (Exact Date)   BMI 36.67 kg/m     General appearance: alert, cooperative and appears stated age  Pelvic: External genitalia:  no lesions              Urethra:  normal appearing urethra with no masses, tenderness or lesions              Bartholins and Skenes: normal                 Vagina: normal appearing vagina with normal color and discharge, no lesions              Cervix: no lesions  The risks of the mirena IUD were reviewed with the patient, including infection, abnormal bleeding and uterine perfortion. Consent was signed.  A speculum was placed in the vagina, the cervix was cleansed with betadine. A  tenaculum was placed on the cervix, the uterus sounded to 8 cm. The cervix was dilated to a 5 hagar dilator  The mirena IUD was inserted without difficulty. The string were cut to 3-4 cm. The tenaculum was removed. Slight oozing from the tenaculum site was stopped with pressure.   The patient tolerated the procedure well.   Post insertion ultrasound the IUD was in place.    Chaperone was present for exam.  Ultrasound images reviewed with the patient  ASSESSMENT IUD insertion    PLAN Mirena placed, confirmed location with ultrasound  F/U in one month for an annual exam and IUD check Reviewed risks and side effects.  Declines genprobe, had negative testing since the last time she was sexually active.    An After Visit Summary was printed and given to the patient.

## 2017-11-26 NOTE — Progress Notes (Signed)
27 y.o. G0P0000 SingleAsianF here for annual exam.  Having irregular bleeding due to Mirena IUD. IUD placed last month and OCP's were discontinued. She spotted for 2 weeks after insertion. Mild cramping for a week after insertion, now fine. Not currently sexually active. Period Duration (Days): 3-4 day(Due to IUD) Period Pattern: (!) Irregular Menstrual Flow: Moderate Menstrual Control: Tampon Menstrual Control Change Freq (Hours): changes tampon eveyr 3 hours Dysmenorrhea: (!) Mild Dysmenorrhea Symptoms: Cramping  Patient's last menstrual period was 10/30/2017 (approximate).          Sexually active: No.  The current method of family planning is Mirena IUD placed 10/25/2017.    Exercising: Yes.    personal training Smoker:  no  Health Maintenance: Pap:  11/24/14 Neg  History of abnormal Pap:  no MMG:  Never TDaP:  6 months ago with primary MD Gardasil: completed all 3   reports that she has never smoked. She has never used smokeless tobacco. She reports that she does not drink alcohol or use drugs. 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  Past Medical History:  Diagnosis Date  . Abnormal uterine bleeding   . Thyroid disease    Hypo    History reviewed. No pertinent surgical history.  Current Outpatient Medications  Medication Sig Dispense Refill  . levonorgestrel (MIRENA) 20 MCG/24HR IUD 1 each by Intrauterine route once.    Marland Kitchen. levothyroxine (SYNTHROID, LEVOTHROID) 50 MCG tablet Take 1 tablet (50 mcg total) by mouth daily. 90 tablet 3   No current facility-administered medications for this visit.     Family History  Problem Relation Age of Onset  . Breast cancer Maternal Aunt   . Cancer Maternal Grandfather   . Heart disease Paternal Grandfather     Review of Systems  Constitutional: Negative.   HENT: Negative.   Eyes: Negative.   Respiratory: Negative.   Cardiovascular: Negative.   Gastrointestinal: Negative.   Endocrine: Negative.   Genitourinary:       Unscheduled bleeding  with IUD  Musculoskeletal: Negative.   Skin: Negative.   Allergic/Immunologic: Negative.   Neurological: Negative.   Hematological: Negative.   Psychiatric/Behavioral: Negative.     Exam:   BP 140/84 (BP Location: Right Arm, Patient Position: Sitting, Cuff Size: Normal)   Pulse 80   Ht 5' 4.17" (1.63 m)   Wt 217 lb 6.4 oz (98.6 kg)   LMP 10/30/2017 (Approximate)   BMI 37.12 kg/m   Weight change: @WEIGHTCHANGE @ Height:   Height: 5' 4.17" (163 cm)  Ht Readings from Last 3 Encounters:  11/28/17 5' 4.17" (1.63 m)  11/09/16 5' 4.5" (1.638 m)    General appearance: alert, cooperative and appears stated age Head: Normocephalic, without obvious abnormality, atraumatic Neck: no adenopathy, supple, symmetrical, trachea midline and thyroid normal to inspection and palpation Lungs: clear to auscultation bilaterally Cardiovascular: regular rate and rhythm Breasts: normal appearance, no masses or tenderness Abdomen: soft, non-tender; non distended,  no masses,  no organomegaly Extremities: extremities normal, atraumatic, no cyanosis or edema Skin: Skin color, texture, turgor normal. No rashes or lesions Lymph nodes: Cervical, supraclavicular, and axillary nodes normal. No abnormal inguinal nodes palpated Neurologic: Grossly normal   Pelvic: External genitalia:  no lesions              Urethra:  normal appearing urethra with no masses, tenderness or lesions              Bartholins and Skenes: normal  Vagina: normal appearing vagina with normal color and discharge, no lesions              Cervix: no lesions and IUD string 5 cm, trimmed to 3-4 cm.               Bimanual Exam:  Uterus:  normal size, contour, position, consistency, mobility, non-tender              Adnexa: no mass, fullness, tenderness               Rectovaginal: Confirms               Anus:  normal sphincter tone, no lesions  Chaperone was present for exam.  A:  Well Woman with normal exam  IUD  check  Slightly elevated BP, she has a f/u with primary this week  P:   Pap with reflex hpv  Labs with her primary MD  Discussed breast self exam  Discussed calcium and vit D intake

## 2017-11-28 ENCOUNTER — Other Ambulatory Visit: Payer: Self-pay

## 2017-11-28 ENCOUNTER — Encounter: Payer: Self-pay | Admitting: Obstetrics and Gynecology

## 2017-11-28 ENCOUNTER — Ambulatory Visit (INDEPENDENT_AMBULATORY_CARE_PROVIDER_SITE_OTHER): Payer: 59 | Admitting: Obstetrics and Gynecology

## 2017-11-28 VITALS — BP 140/84 | HR 80 | Ht 64.17 in | Wt 217.4 lb

## 2017-11-28 DIAGNOSIS — Z01419 Encounter for gynecological examination (general) (routine) without abnormal findings: Secondary | ICD-10-CM

## 2017-11-28 DIAGNOSIS — Z30431 Encounter for routine checking of intrauterine contraceptive device: Secondary | ICD-10-CM

## 2017-11-28 DIAGNOSIS — Z124 Encounter for screening for malignant neoplasm of cervix: Secondary | ICD-10-CM

## 2017-11-28 NOTE — Patient Instructions (Signed)

## 2017-11-29 ENCOUNTER — Other Ambulatory Visit (HOSPITAL_COMMUNITY)
Admission: RE | Admit: 2017-11-29 | Discharge: 2017-11-29 | Disposition: A | Discharge status: Home / Self Care | Payer: 59 | Source: Ambulatory Visit | Attending: Obstetrics and Gynecology | Admitting: Obstetrics and Gynecology

## 2017-11-29 DIAGNOSIS — Z124 Encounter for screening for malignant neoplasm of cervix: Secondary | ICD-10-CM | POA: Insufficient documentation

## 2017-11-29 NOTE — Addendum Note (Signed)
Addended by: Tobi BastosJERTSON, JILL E on: 11/29/2017 01:30 PM   Modules accepted: Orders

## 2017-12-03 LAB — CYTOLOGY - PAP: DIAGNOSIS: NEGATIVE

## 2017-12-12 DIAGNOSIS — Z13220 Encounter for screening for lipoid disorders: Secondary | ICD-10-CM | POA: Diagnosis not present

## 2017-12-12 DIAGNOSIS — Z0000 Encounter for general adult medical examination without abnormal findings: Secondary | ICD-10-CM | POA: Diagnosis not present

## 2017-12-12 DIAGNOSIS — E039 Hypothyroidism, unspecified: Secondary | ICD-10-CM | POA: Diagnosis not present

## 2017-12-12 DIAGNOSIS — R945 Abnormal results of liver function studies: Secondary | ICD-10-CM | POA: Diagnosis not present

## 2017-12-12 DIAGNOSIS — T753XXA Motion sickness, initial encounter: Secondary | ICD-10-CM | POA: Diagnosis not present

## 2018-09-11 ENCOUNTER — Encounter: Payer: Self-pay | Admitting: Internal Medicine

## 2018-09-11 ENCOUNTER — Other Ambulatory Visit: Payer: Self-pay

## 2018-09-11 ENCOUNTER — Ambulatory Visit (INDEPENDENT_AMBULATORY_CARE_PROVIDER_SITE_OTHER): Payer: 59 | Admitting: Internal Medicine

## 2018-09-11 DIAGNOSIS — E039 Hypothyroidism, unspecified: Secondary | ICD-10-CM

## 2018-09-11 MED ORDER — LEVOTHYROXINE SODIUM 25 MCG PO TABS: 25.0000 ug | ORAL_TABLET | Freq: Every day | ORAL | Status: DC

## 2018-09-11 NOTE — Progress Notes (Signed)
Virtual Visit via Video Note  I connected with [name] on 09/11/18 at  2:30 PM EDT by a video enabled telemedicine application and verified that I am speaking with the correct person using two identifiers.  Location patient: home Location provider: work office Persons participating in the virtual visit: patient, provider  I discussed the limitations of evaluation and management by telemedicine and the availability of in person appointments. The patient expressed understanding and agreed to proceed.   HPI: This is a scheduled visit to establish care and discuss chronic conditions.  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 Her PMH is only significant for hypothyroidism on synthroid 25 mcg, states her TSH has been stable for about 2 years. She follows with GYN. She is a never smoker, no ETOH use. Fam Hx is significant for a father with HLD and a PGF with DM and CAD. She had a CPE in September 2019. She has no acute complaints today and does not need medication refills.   ROS: Constitutional: Denies fever, chills, diaphoresis, appetite change and fatigue.  HEENT: Denies photophobia, eye pain, redness, hearing loss, ear pain, congestion, sore throat, rhinorrhea, sneezing, mouth sores, trouble swallowing, neck pain, neck stiffness and tinnitus.   Respiratory: Denies SOB, DOE, cough, chest tightness,  and wheezing.   Cardiovascular: Denies chest pain, palpitations and leg swelling.  Gastrointestinal: Denies nausea, vomiting, abdominal pain, diarrhea, constipation, blood in stool and abdominal distention.  Genitourinary: Denies dysuria, urgency, frequency, hematuria, flank pain and difficulty urinating.  Endocrine: Denies: hot or cold intolerance, sweats, changes in hair or nails, polyuria, polydipsia. Musculoskeletal: Denies myalgias, back pain, joint swelling, arthralgias and gait problem.  Skin: Denies pallor, rash and wound.  Neurological: Denies dizziness, seizures,  syncope, weakness, light-headedness, numbness and headaches.  Hematological: Denies adenopathy. Easy bruising, personal or family bleeding history  Psychiatric/Behavioral: Denies suicidal ideation, mood changes, confusion, nervousness, sleep disturbance and agitation   Past Medical History:  Diagnosis Date  . Abnormal uterine bleeding   . Thyroid disease    Hypo    History reviewed. No pertinent surgical history.  Family History  Problem Relation Age of Onset  . Breast cancer Maternal Aunt   . Cancer Maternal Grandfather   . Heart disease Paternal Grandfather     SOCIAL HX:   reports that she has never smoked. She has never used smokeless tobacco. She reports that she does not drink alcohol or use drugs.   Current Outpatient Medications:  .  levonorgestrel (MIRENA) 20 MCG/24HR IUD, 1 each by Intrauterine route once., Disp: , Rfl:  .  levothyroxine (SYNTHROID) 25 MCG tablet, Take 1 tablet (25 mcg total) by mouth daily before breakfast., Disp: , Rfl:   EXAM:   VITALS per patient if applicable: none reported  GENERAL: alert, oriented, appears well and in no acute distress  HEENT: atraumatic, conjunttiva clear, no obvious abnormalities on inspection of external nose and ears  NECK: normal movements of the head and neck  LUNGS: on inspection no signs of respiratory distress, breathing rate appears normal, no obvious gross increased work of breathing, gasping or wheezing  CV: no obvious cyanosis  MS: moves all visible extremities without noticeable abnormality  PSYCH/NEURO: pleasant and cooperative, no obvious depression or anxiety, speech and thought processing grossly intact  ASSESSMENT AND PLAN:   Hypothyroidism, unspecified type -Continue synthroid at current dose.  Will schedule follow up in September for CPE.    I discussed the assessment and  treatment plan with the patient. The patient was provided an opportunity to ask questions and all were answered. The  patient agreed with the plan and demonstrated an understanding of the instructions.   The patient was advised to call back or seek an in-person evaluation if the symptoms worsen or if the condition fails to improve as anticipated.    Cheryl Jan, MD  [facility]

## 2018-10-04 ENCOUNTER — Telehealth: Payer: Self-pay | Admitting: Internal Medicine

## 2018-10-04 NOTE — Telephone Encounter (Signed)
The patient called in to let you know that she takes 50 MCG and not 25 MCG tablet  levothyroxine (SYNTHROID) 25 MCG tablet  He MCG is supposed to be 50  Can you update this please?

## 2018-10-22 ENCOUNTER — Telehealth: Payer: Self-pay | Admitting: *Deleted

## 2018-10-22 NOTE — Telephone Encounter (Signed)
Pharmacy called to clarify Rx.  [facility] reviewed encounters and did not find a ED/hospital encounter. Notified pharmacy to contact pt or prescribing provider for clarification.  No further EDCM needs identified.

## 2018-11-11 DIAGNOSIS — H52203 Unspecified astigmatism, bilateral: Secondary | ICD-10-CM | POA: Diagnosis not present

## 2018-11-11 DIAGNOSIS — H5213 Myopia, bilateral: Secondary | ICD-10-CM | POA: Diagnosis not present

## 2018-11-28 ENCOUNTER — Encounter: Payer: Self-pay | Admitting: Internal Medicine

## 2018-11-28 DIAGNOSIS — Z7689 Persons encountering health services in other specified circumstances: Secondary | ICD-10-CM

## 2018-11-29 ENCOUNTER — Other Ambulatory Visit: Payer: Self-pay | Admitting: Internal Medicine

## 2018-11-29 ENCOUNTER — Encounter: Payer: Self-pay | Admitting: Internal Medicine

## 2018-11-29 DIAGNOSIS — J309 Allergic rhinitis, unspecified: Secondary | ICD-10-CM | POA: Insufficient documentation

## 2018-11-29 DIAGNOSIS — J45990 Exercise induced bronchospasm: Secondary | ICD-10-CM

## 2018-11-29 MED ORDER — ALBUTEROL SULFATE HFA 108 (90 BASE) MCG/ACT IN AERS: INHALATION_SPRAY | RESPIRATORY_TRACT | 3 refills | Status: DC

## 2018-11-29 MED ORDER — AZELASTINE HCL 0.1 % NA SOLN: 2.0000 | Freq: Two times a day (BID) | NASAL | 3 refills | Status: DC

## 2018-11-29 MED FILL — ALBUTEROL SULFATE HFA 108 (: 108 (90 BAS | 25 days supply | Qty: 9 | Fill #0

## 2018-11-29 MED FILL — AZELASTINE HCL 137 MCG SPRY: 0.1 | 30 days supply | Qty: 30 | Fill #0

## 2018-12-18 ENCOUNTER — Other Ambulatory Visit: Payer: Self-pay | Admitting: Internal Medicine

## 2018-12-18 ENCOUNTER — Other Ambulatory Visit: Payer: Self-pay

## 2018-12-18 ENCOUNTER — Ambulatory Visit (INDEPENDENT_AMBULATORY_CARE_PROVIDER_SITE_OTHER): Payer: 59 | Admitting: Internal Medicine

## 2018-12-18 ENCOUNTER — Encounter: Payer: Self-pay | Admitting: Internal Medicine

## 2018-12-18 VITALS — BP 110/80 | HR 80 | Temp 97.5°F | Ht 65.0 in | Wt 237.3 lb

## 2018-12-18 DIAGNOSIS — E669 Obesity, unspecified: Secondary | ICD-10-CM | POA: Diagnosis not present

## 2018-12-18 DIAGNOSIS — E559 Vitamin D deficiency, unspecified: Secondary | ICD-10-CM

## 2018-12-18 DIAGNOSIS — E039 Hypothyroidism, unspecified: Secondary | ICD-10-CM | POA: Diagnosis not present

## 2018-12-18 DIAGNOSIS — R7302 Impaired glucose tolerance (oral): Secondary | ICD-10-CM | POA: Insufficient documentation

## 2018-12-18 DIAGNOSIS — Z0000 Encounter for general adult medical examination without abnormal findings: Secondary | ICD-10-CM | POA: Diagnosis not present

## 2018-12-18 DIAGNOSIS — J45990 Exercise induced bronchospasm: Secondary | ICD-10-CM | POA: Diagnosis not present

## 2018-12-18 DIAGNOSIS — Z8249 Family history of ischemic heart disease and other diseases of the circulatory system: Secondary | ICD-10-CM | POA: Diagnosis not present

## 2018-12-18 DIAGNOSIS — E785 Hyperlipidemia, unspecified: Secondary | ICD-10-CM | POA: Insufficient documentation

## 2018-12-18 LAB — CBC WITH DIFFERENTIAL/PLATELET
Basophils Absolute: 0 10*3/uL (ref 0.0–0.1)
Basophils Relative: 0.5 % (ref 0.0–3.0)
Eosinophils Absolute: 0.2 10*3/uL (ref 0.0–0.7)
Eosinophils Relative: 2.5 % (ref 0.0–5.0)
HCT: 39.5 % (ref 36.0–46.0)
Hemoglobin: 13.2 g/dL (ref 12.0–15.0)
Lymphocytes Relative: 27.5 % (ref 12.0–46.0)
Lymphs Abs: 2.1 10*3/uL (ref 0.7–4.0)
MCHC: 33.4 g/dL (ref 30.0–36.0)
MCV: 84.1 fl (ref 78.0–100.0)
Monocytes Absolute: 0.6 10*3/uL (ref 0.1–1.0)
Monocytes Relative: 8.5 % (ref 3.0–12.0)
Neutro Abs: 4.6 10*3/uL (ref 1.4–7.7)
Neutrophils Relative %: 61 % (ref 43.0–77.0)
Platelets: 341 10*3/uL (ref 150.0–400.0)
RBC: 4.7 Mil/uL (ref 3.87–5.11)
RDW: 13.6 % (ref 11.5–15.5)
WBC: 7.5 10*3/uL (ref 4.0–10.5)

## 2018-12-18 LAB — LIPID PANEL
Cholesterol: 193 mg/dL (ref 0–200)
HDL: 46.7 mg/dL (ref >39.00)
LDL Cholesterol: 120 mg/dL — ABNORMAL HIGH (ref 0–99)
NonHDL: 146.06
Total CHOL/HDL Ratio: 4
Triglycerides: 130 mg/dL (ref 0.0–149.0)
VLDL: 26 mg/dL (ref 0.0–40.0)

## 2018-12-18 LAB — COMPREHENSIVE METABOLIC PANEL
ALT: 22 U/L (ref 0–35)
AST: 14 U/L (ref 0–37)
Albumin: 3.9 g/dL (ref 3.5–5.2)
Alkaline Phosphatase: 86 U/L (ref 39–117)
BUN: 14 mg/dL (ref 6–23)
CO2: 27 mEq/L (ref 19–32)
Calcium: 9.1 mg/dL (ref 8.4–10.5)
Chloride: 105 mEq/L (ref 96–112)
Creatinine, Ser: 0.65 mg/dL (ref 0.40–1.20)
GFR: 106.79 mL/min (ref >60.00)
Glucose, Bld: 103 mg/dL — ABNORMAL HIGH (ref 70–99)
Potassium: 4.5 mEq/L (ref 3.5–5.1)
Sodium: 138 mEq/L (ref 135–145)
Total Bilirubin: 0.3 mg/dL (ref 0.2–1.2)
Total Protein: 6.9 g/dL (ref 6.0–8.3)

## 2018-12-18 LAB — VITAMIN D 25 HYDROXY (VIT D DEFICIENCY, FRACTURES): VITD: 11.64 ng/mL — ABNORMAL LOW (ref 30.00–100.00)

## 2018-12-18 LAB — VITAMIN B12: Vitamin B-12: 427 pg/mL (ref 211–911)

## 2018-12-18 LAB — HEMOGLOBIN A1C: Hgb A1c MFr Bld: 6.2 % (ref 4.6–6.5)

## 2018-12-18 LAB — TSH: TSH: 4 u[IU]/mL (ref 0.35–4.50)

## 2018-12-18 MED ORDER — VITAMIN D (ERGOCALCIFEROL) 1.25 MG (50000 UNIT) PO CAPS: 50000.0000 [IU] | ORAL_CAPSULE | ORAL | 0 refills | Status: DC

## 2018-12-18 MED ORDER — LEVOTHYROXINE SODIUM 50 MCG PO TABS: 50.0000 ug | ORAL_TABLET | Freq: Every day | ORAL | 1 refills | Status: DC

## 2018-12-18 NOTE — Patient Instructions (Signed)
-Nice seeing you today!!  -Lab work today; will notify you once results are available.  -Schedule follow up in 6 months.   Preventive Care 70-30 Years Old, Female Preventive care refers to visits with your health care provider and lifestyle choices that can promote health and wellness. This includes:  A yearly physical exam. This may also be called an annual well check.  Regular dental visits and eye exams.  Immunizations.  Screening for certain conditions.  Healthy lifestyle choices, such as eating a healthy diet, getting regular exercise, not using drugs or products that contain nicotine and tobacco, and limiting alcohol use. What can I expect for my preventive care visit? Physical exam Your health care provider will check your:  Height and weight. This may be used to calculate body mass index (BMI), which tells if you are at a healthy weight.  Heart rate and blood pressure.  Skin for abnormal spots. Counseling Your health care provider may ask you questions about your:  Alcohol, tobacco, and drug use.  Emotional well-being.  Home and relationship well-being.  Sexual activity.  Eating habits.  Work and work Statistician.  Method of birth control.  Menstrual cycle.  Pregnancy history. What immunizations do I need?  Influenza (flu) vaccine  This is recommended every year. Tetanus, diphtheria, and pertussis (Tdap) vaccine  You may need a Td booster every 10 years. Varicella (chickenpox) vaccine  You may need this if you have not been vaccinated. Human papillomavirus (HPV) vaccine  If recommended by your health care provider, you may need three doses over 6 months. Measles, mumps, and rubella (MMR) vaccine  You may need at least one dose of MMR. You may also need a second dose. Meningococcal conjugate (MenACWY) vaccine  One dose is recommended if you are age 29-21 years and a first-year college student living in a residence hall, or if you have one of  several medical conditions. You may also need additional booster doses. Pneumococcal conjugate (PCV13) vaccine  You may need this if you have certain conditions and were not previously vaccinated. Pneumococcal polysaccharide (PPSV23) vaccine  You may need one or two doses if you smoke cigarettes or if you have certain conditions. Hepatitis A vaccine  You may need this if you have certain conditions or if you travel or work in places where you may be exposed to hepatitis A. Hepatitis B vaccine  You may need this if you have certain conditions or if you travel or work in places where you may be exposed to hepatitis B. Haemophilus influenzae type b (Hib) vaccine  You may need this if you have certain conditions. You may receive vaccines as individual doses or as more than one vaccine together in one shot (combination vaccines). Talk with your health care provider about the risks and benefits of combination vaccines. What tests do I need?  Blood tests  Lipid and cholesterol levels. These may be checked every 5 years starting at age 68.  Hepatitis C test.  Hepatitis B test. Screening  Diabetes screening. This is done by checking your blood sugar (glucose) after you have not eaten for a while (fasting).  Sexually transmitted disease (STD) testing.  BRCA-related cancer screening. This may be done if you have a family history of breast, ovarian, tubal, or peritoneal cancers.  Pelvic exam and Pap test. This may be done every 3 years starting at age 87. Starting at age 86, this may be done every 5 years if you have a Pap test in combination  with an HPV test. Talk with your health care provider about your test results, treatment options, and if necessary, the need for more tests. Follow these instructions at home: Eating and drinking   Eat a diet that includes fresh fruits and vegetables, whole grains, lean protein, and low-fat dairy.  Take vitamin and mineral supplements as  recommended by your health care provider.  Do not drink alcohol if: ? Your health care provider tells you not to drink. ? You are pregnant, may be pregnant, or are planning to become pregnant.  If you drink alcohol: ? Limit how much you have to 0-1 drink a day. ? Be aware of how much alcohol is in your drink. In the [place] one drink equals one 12 oz bottle of beer (355 mL), one 5 oz glass of wine (148 mL), or one 1 oz glass of hard liquor (44 mL). Lifestyle  Take daily care of your teeth and gums.  Stay active. Exercise for at least 30 minutes on 5 or more days each week.  Do not use any products that contain nicotine or tobacco, such as cigarettes, e-cigarettes, and chewing tobacco. If you need help quitting, ask your health care provider.  If you are sexually active, practice safe sex. Use a condom or other form of birth control (contraception) in order to prevent pregnancy and STIs (sexually transmitted infections). If you plan to become pregnant, see your health care provider for a preconception visit. What's next?  Visit your health care provider once a year for a well check visit.  Ask your health care provider how often you should have your eyes and teeth checked.  Stay up to date on all vaccines. This information is not intended to replace advice given to you by your health care provider. Make sure you discuss any questions you have with your health care provider. Document Released: 05/30/2001 Document Revised: 12/13/2017 Document Reviewed: 12/13/2017 Elsevier Patient Education  2020 [employer].

## 2018-12-18 NOTE — Progress Notes (Signed)
Established Patient Office Visit     CC/Reason for Visit: Annual preventive exam  HPI: Cheryl Carr is a 28 y.o. female who is coming in today for the above mentioned reasons. Past Medical History is significant for: Hypothyroidism well-controlled on Synthroid 25 mcg as well as exercise-induced asthma and obesity.  She has no acute complaints today.  She has routine eye and dental care.  She has a GYN but does her Pap smears.  No family history of breast or colon cancer.  She does have an early family history of coronary artery disease.   Past Medical/Surgical History: Past Medical History:  Diagnosis Date  . Abnormal uterine bleeding   . Thyroid disease    Hypo    No past surgical history on file.  Social History:  reports that she has never smoked. She has never used smokeless tobacco. She reports that she does not drink alcohol or use drugs.  Allergies: No Known Allergies  Family History:  Family History  Problem Relation Age of Onset  . Breast cancer Maternal Aunt   . Cancer Maternal Grandfather   . Heart disease Paternal Grandfather      Current Outpatient Medications:  .  albuterol (VENTOLIN HFA) 108 (90 Base) MCG/ACT inhaler, Take 2 puffs prior to exercise, Disp: 6.7 g, Rfl: 3 .  azelastine (ASTELIN) 0.1 % nasal spray, Place 2 sprays into both nostrils 2 (two) times daily. Use in each nostril as directed, Disp: 30 mL, Rfl: 3 .  cetirizine (ZYRTEC ALLERGY) 10 MG tablet, , Disp: , Rfl:  .  levonorgestrel (MIRENA) 20 MCG/24HR IUD, 1 each by Intrauterine route once., Disp: , Rfl:  .  levothyroxine (SYNTHROID) 50 MCG tablet, Take 50 mcg by mouth daily before breakfast., Disp: , Rfl:   Review of Systems:  Constitutional: Denies fever, chills, diaphoresis, appetite change and fatigue.  HEENT: Denies photophobia, eye pain, redness, hearing loss, ear pain, congestion, sore throat, rhinorrhea, sneezing, mouth sores, trouble swallowing, neck pain, neck stiffness and  tinnitus.   Respiratory: Denies SOB, DOE, cough, chest tightness,  and wheezing.   Cardiovascular: Denies chest pain, palpitations and leg swelling.  Gastrointestinal: Denies nausea, vomiting, abdominal pain, diarrhea, constipation, blood in stool and abdominal distention.  Genitourinary: Denies dysuria, urgency, frequency, hematuria, flank pain and difficulty urinating.  Endocrine: Denies: hot or cold intolerance, sweats, changes in hair or nails, polyuria, polydipsia. Musculoskeletal: Denies myalgias, back pain, joint swelling, arthralgias and gait problem.  Skin: Denies pallor, rash and wound.  Neurological: Denies dizziness, seizures, syncope, weakness, light-headedness, numbness and headaches.  Hematological: Denies adenopathy. Easy bruising, personal or family bleeding history  Psychiatric/Behavioral: Denies suicidal ideation, mood changes, confusion, nervousness, sleep disturbance and agitation    Physical Exam: Vitals:   12/18/18 0749  BP: 110/80  Pulse: 80  Temp: (!) 97.5 F (36.4 C)  TempSrc: Temporal  SpO2: 93%  Weight: 237 lb 4.8 oz (107.6 kg)  Height: '5\' 5"'$  (1.651 m)    Body mass index is 39.49 kg/m.   Constitutional: NAD, calm, comfortable Eyes: PERRL, lids and conjunctivae normal ENMT: Mucous membranes are moist.Tympanic membrane is pearly white, no erythema or bulging. Neck: normal, supple, no masses, no thyromegaly Respiratory: clear to auscultation bilaterally, no wheezing, no crackles. Normal respiratory effort. No accessory muscle use.  Cardiovascular: Regular rate and rhythm, no murmurs / rubs / gallops. No extremity edema. 2+ pedal pulses. No carotid bruits.  Abdomen: no tenderness, no masses palpated. No hepatosplenomegaly. Bowel sounds positive.  Musculoskeletal: no  clubbing / cyanosis. No joint deformity upper and lower extremities. Good ROM, no contractures. Normal muscle tone.  Skin: no rashes, lesions, ulcers. No induration Neurologic: CN 2-12  grossly intact. Sensation intact, DTR normal. Strength 5/5 in all 4.  Psychiatric: Normal judgment and insight. Alert and oriented x 3. Normal mood.      Office Visit from 12/18/2018 in [place] at [place]  PHQ-9 Total Score  0       Impression and Plan:  Encounter for preventive health examination  -Has routine eye and dental care. -Due for flu vaccine which she will receive at work, otherwise immunizations are up-to-date. -Healthy lifestyle has been discussed in detail. -Screening labs to be performed today. -She sees her GYN annually who performs her Pap smears, she does not have a history of abnormal Paps. -Commence routine colon cancer screening at age 32. -Commence routine breast cancer screening at age 50.  Obesity (BMI 35.0-39.9 without comorbidity)  -Discussed healthy lifestyle, including increased physical activity and better food choices to promote weight loss. -She is on the waiting list for the [facility] center.   Hypothyroidism, unspecified type -Check TSH.  For now, continue current Synthroid dose.  Exercise-induced asthma -Uses albuterol as needed prior to exercise.    Patient Instructions  -Nice seeing you today!!  -Lab work today; will notify you once results are available.  -Schedule follow up in 6 months.   Preventive Care 24-66 Years Old, Female Preventive care refers to visits with your health care provider and lifestyle choices that can promote health and wellness. This includes:  A yearly physical exam. This may also be called an annual well check.  Regular dental visits and eye exams.  Immunizations.  Screening for certain conditions.  Healthy lifestyle choices, such as eating a healthy diet, getting regular exercise, not using drugs or products that contain nicotine and tobacco, and limiting alcohol use. What can I expect for my preventive care visit? Physical exam Your health care provider will check your:   Height and weight. This may be used to calculate body mass index (BMI), which tells if you are at a healthy weight.  Heart rate and blood pressure.  Skin for abnormal spots. Counseling Your health care provider may ask you questions about your:  Alcohol, tobacco, and drug use.  Emotional well-being.  Home and relationship well-being.  Sexual activity.  Eating habits.  Work and work Statistician.  Method of birth control.  Menstrual cycle.  Pregnancy history. What immunizations do I need?  Influenza (flu) vaccine  This is recommended every year. Tetanus, diphtheria, and pertussis (Tdap) vaccine  You may need a Td booster every 10 years. Varicella (chickenpox) vaccine  You may need this if you have not been vaccinated. Human papillomavirus (HPV) vaccine  If recommended by your health care provider, you may need three doses over 6 months. Measles, mumps, and rubella (MMR) vaccine  You may need at least one dose of MMR. You may also need a second dose. Meningococcal conjugate (MenACWY) vaccine  One dose is recommended if you are age 45-21 years and a first-year college student living in a residence hall, or if you have one of several medical conditions. You may also need additional booster doses. Pneumococcal conjugate (PCV13) vaccine  You may need this if you have certain conditions and were not previously vaccinated. Pneumococcal polysaccharide (PPSV23) vaccine  You may need one or two doses if you smoke cigarettes or if you have certain conditions. Hepatitis A  vaccine  You may need this if you have certain conditions or if you travel or work in places where you may be exposed to hepatitis A. Hepatitis B vaccine  You may need this if you have certain conditions or if you travel or work in places where you may be exposed to hepatitis B. Haemophilus influenzae type b (Hib) vaccine  You may need this if you have certain conditions. You may receive vaccines as  individual doses or as more than one vaccine together in one shot (combination vaccines). Talk with your health care provider about the risks and benefits of combination vaccines. What tests do I need?  Blood tests  Lipid and cholesterol levels. These may be checked every 5 years starting at age 54.  Hepatitis C test.  Hepatitis B test. Screening  Diabetes screening. This is done by checking your blood sugar (glucose) after you have not eaten for a while (fasting).  Sexually transmitted disease (STD) testing.  BRCA-related cancer screening. This may be done if you have a family history of breast, ovarian, tubal, or peritoneal cancers.  Pelvic exam and Pap test. This may be done every 3 years starting at age 24. Starting at age 35, this may be done every 5 years if you have a Pap test in combination with an HPV test. Talk with your health care provider about your test results, treatment options, and if necessary, the need for more tests. Follow these instructions at home: Eating and drinking   Eat a diet that includes fresh fruits and vegetables, whole grains, lean protein, and low-fat dairy.  Take vitamin and mineral supplements as recommended by your health care provider.  Do not drink alcohol if: ? Your health care provider tells you not to drink. ? You are pregnant, may be pregnant, or are planning to become pregnant.  If you drink alcohol: ? Limit how much you have to 0-1 drink a day. ? Be aware of how much alcohol is in your drink. In the [place] one drink equals one 12 oz bottle of beer (355 mL), one 5 oz glass of wine (148 mL), or one 1 oz glass of hard liquor (44 mL). Lifestyle  Take daily care of your teeth and gums.  Stay active. Exercise for at least 30 minutes on 5 or more days each week.  Do not use any products that contain nicotine or tobacco, such as cigarettes, e-cigarettes, and chewing tobacco. If you need help quitting, ask your health care provider.  If  you are sexually active, practice safe sex. Use a condom or other form of birth control (contraception) in order to prevent pregnancy and STIs (sexually transmitted infections). If you plan to become pregnant, see your health care provider for a preconception visit. What's next?  Visit your health care provider once a year for a well check visit.  Ask your health care provider how often you should have your eyes and teeth checked.  Stay up to date on all vaccines. This information is not intended to replace advice given to you by your health care provider. Make sure you discuss any questions you have with your health care provider. Document Released: 05/30/2001 Document Revised: 12/13/2017 Document Reviewed: 12/13/2017 Elsevier Patient Education  2020 [place], MD [facility]

## 2018-12-19 ENCOUNTER — Encounter: Payer: Self-pay | Admitting: Internal Medicine

## 2018-12-19 DIAGNOSIS — E669 Obesity, unspecified: Secondary | ICD-10-CM

## 2019-01-13 ENCOUNTER — Other Ambulatory Visit: Payer: Self-pay

## 2019-01-15 ENCOUNTER — Ambulatory Visit: Payer: 59 | Admitting: Certified Nurse Midwife

## 2019-01-15 ENCOUNTER — Encounter: Payer: Self-pay | Admitting: Obstetrics and Gynecology

## 2019-01-15 ENCOUNTER — Other Ambulatory Visit: Payer: Self-pay

## 2019-01-15 ENCOUNTER — Ambulatory Visit (INDEPENDENT_AMBULATORY_CARE_PROVIDER_SITE_OTHER): Payer: 59 | Admitting: Obstetrics and Gynecology

## 2019-01-15 VITALS — BP 112/80 | HR 64 | Temp 97.4°F | Ht 64.17 in | Wt 234.0 lb

## 2019-01-15 DIAGNOSIS — Z01419 Encounter for gynecological examination (general) (routine) without abnormal findings: Secondary | ICD-10-CM | POA: Diagnosis not present

## 2019-01-15 DIAGNOSIS — Z30431 Encounter for routine checking of intrauterine contraceptive device: Secondary | ICD-10-CM

## 2019-01-15 DIAGNOSIS — E038 Other specified hypothyroidism: Secondary | ICD-10-CM

## 2019-01-15 DIAGNOSIS — R7303 Prediabetes: Secondary | ICD-10-CM

## 2019-01-15 NOTE — Progress Notes (Signed)
28 y.o. G0P0000 Single Asian Not Hispanic or Latino female here for annual exam.  She has a mirena IUD, placed in 7/19. Recent TSH was 4.   Recently diagnosed with prediabetes. Gained weight with Covid. She has started an appetite suppressant. She has started being seen at a Bariatric Clinic.   Period Cycle (Days): 28 Period Duration (Days): 7-10 days Period Pattern: Regular Menstrual Flow: Moderate, Light Menstrual Control: Tampon Menstrual Control Change Freq (Hours): changes tampon every 4-5 hours Dysmenorrhea: None  Cycles are much lighter than prior to OCP's. Tolerable. Not sexually active.   Patient's last menstrual period was 12/04/2018 (exact date).          Sexually active: No.  The current method of family planning is IUD.    Exercising: Yes.    on demand exercise class, rowing Smoker:  no  Health Maintenance: Pap:11/29/2017 WNL, 11/24/14 WNL History of abnormal Pap:no SEG:BTDVV TDaP:12/17/2017 Gardasil:completedall 3   reports that she has never smoked. She has never used smokeless tobacco. She reports that she does not drink alcohol or use drugs. 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 She bought a house earlier this year.   Past Medical History:  Diagnosis Date  . Abnormal uterine bleeding   . Thyroid disease    Hypo    History reviewed. No pertinent surgical history.  Current Outpatient Medications  Medication Sig Dispense Refill  . albuterol (VENTOLIN HFA) 108 (90 Base) MCG/ACT inhaler Take 2 puffs prior to exercise 6.7 g 3  . azelastine (ASTELIN) 0.1 % nasal spray Place 2 sprays into both nostrils 2 (two) times daily. Use in each nostril as directed 30 mL 3  . cetirizine (ZYRTEC ALLERGY) 10 MG tablet     . Diethylpropion HCl CR 75 MG TB24 Take 75 mg by mouth daily.    Marland Kitchen levonorgestrel (MIRENA) 20 MCG/24HR IUD 1 each by Intrauterine route once.    Marland Kitchen levothyroxine (SYNTHROID) 50 MCG tablet Take 1 tablet (50 mcg total) by mouth daily before  breakfast. 90 tablet 1  . Vitamin D, Ergocalciferol, (DRISDOL) 1.25 MG (50000 UT) CAPS capsule Take 1 capsule (50,000 Units total) by mouth every 7 (seven) days for 12 doses. 12 capsule 0   No current facility-administered medications for this visit.     Family History  Problem Relation Age of Onset  . Breast cancer Maternal Aunt   . Cancer Maternal Grandfather   . Heart disease Paternal Grandfather     Review of Systems  Constitutional: Negative.   HENT: Negative.   Eyes: Negative.   Respiratory: Negative.   Cardiovascular: Negative.   Gastrointestinal: Negative.   Endocrine: Negative.   Genitourinary: Positive for menstrual problem.  Musculoskeletal: Negative.   Skin: Negative.   Allergic/Immunologic: Negative.   Neurological: Negative.   Hematological: Negative.   Psychiatric/Behavioral: Negative.     Exam:   BP 112/80 (BP Location: Right Arm, Patient Position: Sitting, Cuff Size: Normal)   Pulse 64   Temp (!) 97.4 F (36.3 C) (Skin)   Ht 5' 4.17" (1.63 m)   Wt 234 lb (106.1 kg)   LMP 12/04/2018 (Exact Date)   BMI 39.95 kg/m   Weight change: @WEIGHTCHANGE @ Height:   Height: 5' 4.17" (163 cm)  Ht Readings from Last 3 Encounters:  01/15/19 5' 4.17" (1.63 m)  12/18/18 5\' 5"  (1.651 m)  11/28/17 5' 4.17" (1.63 m)    General appearance: alert, cooperative and appears stated age Head: Normocephalic, without obvious abnormality, atraumatic Neck:  no adenopathy, supple, symmetrical, trachea midline and thyroid normal to inspection and palpation Lungs: clear to auscultation bilaterally Cardiovascular: regular rate and rhythm Breasts: normal appearance, no masses or tenderness Abdomen: soft, non-tender; non distended,  no masses,  no organomegaly Extremities: extremities normal, atraumatic, no cyanosis or edema Skin: Skin color, texture, turgor normal. No rashes or lesions Lymph nodes: Cervical, supraclavicular, and axillary nodes normal. No abnormal inguinal nodes  palpated Neurologic: Grossly normal   Pelvic: External genitalia:  no lesions              Urethra:  normal appearing urethra with no masses, tenderness or lesions              Bartholins and Skenes: normal                 Vagina: normal appearing vagina with normal color and discharge, no lesions              Cervix: no lesions and IUD string 4 cm.                Bimanual Exam:  Uterus:  normal size, contour, position, consistency, mobility, non-tender              Adnexa: no mass, fullness, tenderness               Rectovaginal: Confirms               Anus:  normal sphincter tone, no lesions  Chaperone was present for exam.  A:  Well Woman with normal exam  IUD check  Hypothyroid on synthroid  Prediabetes, in a weight loss clinic  P:   No pap this year  Labs with primary  Discussed breast self exam  Discussed calcium and vit D intake

## 2019-01-15 NOTE — Patient Instructions (Signed)
EXERCISE AND DIET:  We recommended that you start or continue a regular exercise program for good health. Regular exercise means any activity that makes your heart beat faster and makes you sweat.  We recommend exercising at least 30 minutes per day at least 3 days a week, preferably 4 or 5.  We also recommend a diet low in fat and sugar.  Inactivity, poor dietary choices and obesity can cause diabetes, heart attack, stroke, and kidney damage, among others.    ALCOHOL AND SMOKING:  Women should limit their alcohol intake to no more than 7 drinks/beers/glasses of wine (combined, not each!) per week. Moderation of alcohol intake to this level decreases your risk of breast cancer and liver damage. And of course, no recreational drugs are part of a healthy lifestyle.  And absolutely no smoking or even second hand smoke. Most people know smoking can cause heart and lung diseases, but did you know it also contributes to weakening of your bones? Aging of your skin?  Yellowing of your teeth and nails?  CALCIUM AND VITAMIN D:  Adequate intake of calcium and Vitamin D are recommended.  The recommendations for exact amounts of these supplements seem to change often, but generally speaking 1,000 mg of calcium (between diet and supplement) and 800 units of Vitamin D per day seems prudent. Certain women may benefit from higher intake of Vitamin D.  If you are among these women, your doctor will have told you during your visit.    PAP SMEARS:  Pap smears, to check for cervical cancer or precancers,  have traditionally been done yearly, although recent scientific advances have shown that most women can have pap smears less often.  However, every woman still should have a physical exam from her gynecologist every year. It will include a breast check, inspection of the vulva and vagina to check for abnormal growths or skin changes, a visual exam of the cervix, and then an exam to evaluate the size and shape of the uterus and  ovaries.  And after 30 years of age, a rectal exam is indicated to check for rectal cancers. We will also provide age appropriate advice regarding health maintenance, like when you should have certain vaccines, screening for sexually transmitted diseases, bone density testing, colonoscopy, mammograms, etc.   MAMMOGRAMS:  All women over 40 years old should have a yearly mammogram. Many facilities now offer a "3D" mammogram, which may cost around $50 extra out of pocket. If possible,  we recommend you accept the option to have the 3D mammogram performed.  It both reduces the number of women who will be called back for extra views which then turn out to be normal, and it is better than the routine mammogram at detecting truly abnormal areas.    COLON CANCER SCREENING: Now recommend starting at age 45. At this time colonoscopy is not covered for routine screening until 50. There are take home tests that can be done between 45-49.   COLONOSCOPY:  Colonoscopy to screen for colon cancer is recommended for all women at age 50.  We know, you hate the idea of the prep.  We agree, BUT, having colon cancer and not knowing it is worse!!  Colon cancer so often starts as a polyp that can be seen and removed at colonscopy, which can quite literally save your life!  And if your first colonoscopy is normal and you have no family history of colon cancer, most women don't have to have it again for   10 years.  Once every ten years, you can do something that may end up saving your life, right?  We will be happy to help you get it scheduled when you are ready.  Be sure to check your insurance coverage so you understand how much it will cost.  It may be covered as a preventative service at no cost, but you should check your particular policy.      Breast Self-Awareness Breast self-awareness means being familiar with how your breasts look and feel. It involves checking your breasts regularly and reporting any changes to your  health care provider. Practicing breast self-awareness is important. A change in your breasts can be a sign of a serious medical problem. Being familiar with how your breasts look and feel allows you to find any problems early, when treatment is more likely to be successful. All women should practice breast self-awareness, including women who have had breast implants. How to do a breast self-exam One way to learn what is normal for your breasts and whether your breasts are changing is to do a breast self-exam. To do a breast self-exam: Look for Changes  1. Remove all the clothing above your waist. 2. Stand in front of a mirror in a room with good lighting. 3. Put your hands on your hips. 4. Push your hands firmly downward. 5. Compare your breasts in the mirror. Look for differences between them (asymmetry), such as: ? Differences in shape. ? Differences in size. ? Puckers, dips, and bumps in one breast and not the other. 6. Look at each breast for changes in your skin, such as: ? Redness. ? Scaly areas. 7. Look for changes in your nipples, such as: ? Discharge. ? Bleeding. ? Dimpling. ? Redness. ? A change in position. Feel for Changes Carefully feel your breasts for lumps and changes. It is best to do this while lying on your back on the floor and again while sitting or standing in the shower or tub with soapy water on your skin. Feel each breast in the following way:  Place the arm on the side of the breast you are examining above your head.  Feel your breast with the other hand.  Start in the nipple area and make  inch (2 cm) overlapping circles to feel your breast. Use the pads of your three middle fingers to do this. Apply light pressure, then medium pressure, then firm pressure. The light pressure will allow you to feel the tissue closest to the skin. The medium pressure will allow you to feel the tissue that is a little deeper. The firm pressure will allow you to feel the tissue  close to the ribs.  Continue the overlapping circles, moving downward over the breast until you feel your ribs below your breast.  Move one finger-width toward the center of the body. Continue to use the  inch (2 cm) overlapping circles to feel your breast as you move slowly up toward your collarbone.  Continue the up and down exam using all three pressures until you reach your armpit.  Write Down What You Find  Write down what is normal for each breast and any changes that you find. Keep a written record with breast changes or normal findings for each breast. By writing this information down, you do not need to depend only on memory for size, tenderness, or location. Write down where you are in your menstrual cycle, if you are still menstruating. If you are having trouble noticing differences   in your breasts, do not get discouraged. With time you will become more familiar with the variations in your breasts and more comfortable with the exam. How often should I examine my breasts? Examine your breasts every month. If you are breastfeeding, the best time to examine your breasts is after a feeding or after using a breast pump. If you menstruate, the best time to examine your breasts is 5-7 days after your period is over. During your period, your breasts are lumpier, and it may be more difficult to notice changes. When should I see my health care provider? See your health care provider if you notice:  A change in shape or size of your breasts or nipples.  A change in the skin of your breast or nipples, such as a reddened or scaly area.  Unusual discharge from your nipples.  A lump or thick area that was not there before.  Pain in your breasts.  Anything that concerns you.  

## 2019-02-05 ENCOUNTER — Encounter: Payer: Self-pay | Admitting: Internal Medicine

## 2019-02-07 ENCOUNTER — Other Ambulatory Visit: Payer: Self-pay | Admitting: Internal Medicine

## 2019-02-07 DIAGNOSIS — E559 Vitamin D deficiency, unspecified: Secondary | ICD-10-CM

## 2019-03-03 ENCOUNTER — Other Ambulatory Visit (INDEPENDENT_AMBULATORY_CARE_PROVIDER_SITE_OTHER): Payer: 59

## 2019-03-03 ENCOUNTER — Other Ambulatory Visit: Payer: Self-pay

## 2019-03-03 DIAGNOSIS — E559 Vitamin D deficiency, unspecified: Secondary | ICD-10-CM | POA: Diagnosis not present

## 2019-03-03 LAB — VITAMIN D 25 HYDROXY (VIT D DEFICIENCY, FRACTURES): VITD: 23.63 ng/mL — ABNORMAL LOW (ref 30.00–100.00)

## 2019-03-05 ENCOUNTER — Other Ambulatory Visit: Payer: Self-pay | Admitting: Internal Medicine

## 2019-03-05 DIAGNOSIS — E559 Vitamin D deficiency, unspecified: Secondary | ICD-10-CM

## 2019-03-05 MED ORDER — VITAMIN D (ERGOCALCIFEROL) 1.25 MG (50000 UNIT) PO CAPS: 50000.0000 [IU] | ORAL_CAPSULE | ORAL | 0 refills | Status: AC

## 2019-04-23 ENCOUNTER — Other Ambulatory Visit: Payer: Self-pay

## 2019-04-23 ENCOUNTER — Ambulatory Visit: Payer: 59 | Admitting: Internal Medicine

## 2019-04-23 ENCOUNTER — Encounter: Payer: Self-pay | Admitting: Internal Medicine

## 2019-04-23 VITALS — BP 102/70 | HR 71 | Temp 98.1°F | Wt 213.6 lb

## 2019-04-23 DIAGNOSIS — E038 Other specified hypothyroidism: Secondary | ICD-10-CM

## 2019-04-23 DIAGNOSIS — E559 Vitamin D deficiency, unspecified: Secondary | ICD-10-CM | POA: Diagnosis not present

## 2019-04-23 DIAGNOSIS — R7302 Impaired glucose tolerance (oral): Secondary | ICD-10-CM | POA: Diagnosis not present

## 2019-04-23 DIAGNOSIS — E785 Hyperlipidemia, unspecified: Secondary | ICD-10-CM | POA: Diagnosis not present

## 2019-04-23 DIAGNOSIS — R7309 Other abnormal glucose: Secondary | ICD-10-CM | POA: Diagnosis not present

## 2019-04-23 LAB — POCT GLYCOSYLATED HEMOGLOBIN (HGB A1C): Hemoglobin A1C: 5.9 % — AB (ref 4.0–5.6)

## 2019-04-23 NOTE — Progress Notes (Signed)
Established Patient Office Visit     This visit occurred during the SARS-CoV-2 public health emergency.  Safety protocols were in place, including screening questions prior to the visit, additional usage of staff PPE, and extensive cleaning of exam room while observing appropriate contact time as indicated for disinfecting solutions.    CC/Reason for Visit: Follow-up chronic medical conditions  HPI: Cheryl Carr is a 29 y.o. female who is coming in today for the above mentioned reasons. Past Medical History is significant for: Hypothyroidism well-controlled on Synthroid, morbid obesity, exercise-induced asthma, vitamin D deficiency and recently diagnosed impaired glucose tolerance.  I last saw her in September at which time she had an A1c of 6.2.  She was asked to follow a low-fat diet and to attempt mild to moderate weight loss.  She has been able to lose 20 pounds since her last visit.  She has no complaints.   Past Medical/Surgical History: Past Medical History:  Diagnosis Date  . Abnormal uterine bleeding   . Prediabetes   . Thyroid disease    Hypo    No past surgical history on file.  Social History:  reports that she has never smoked. She has never used smokeless tobacco. She reports that she does not drink alcohol or use drugs.  Allergies: No Known Allergies  Family History:  Family History  Problem Relation Age of Onset  . Breast cancer Maternal Aunt   . Cancer Maternal Grandfather   . Heart disease Paternal Grandfather      Current Outpatient Medications:  .  albuterol (VENTOLIN HFA) 108 (90 Base) MCG/ACT inhaler, Take 2 puffs prior to exercise, Disp: 6.7 g, Rfl: 3 .  azelastine (ASTELIN) 0.1 % nasal spray, Place 2 sprays into both nostrils 2 (two) times daily. Use in each nostril as directed, Disp: 30 mL, Rfl: 3 .  cetirizine (ZYRTEC ALLERGY) 10 MG tablet, , Disp: , Rfl:  .  Diethylpropion HCl CR 75 MG TB24, Take 75 mg by mouth daily., Disp: , Rfl:  .   levonorgestrel (MIRENA) 20 MCG/24HR IUD, 1 each by Intrauterine route once., Disp: , Rfl:  .  levothyroxine (SYNTHROID) 50 MCG tablet, Take 1 tablet (50 mcg total) by mouth daily before breakfast., Disp: 90 tablet, Rfl: 1 .  Vitamin D, Ergocalciferol, (DRISDOL) 1.25 MG (50000 UT) CAPS capsule, Take 1 capsule (50,000 Units total) by mouth every 7 (seven) days for 12 doses., Disp: 12 capsule, Rfl: 0  Review of Systems:  Constitutional: Denies fever, chills, diaphoresis, appetite change and fatigue.  HEENT: Denies photophobia, eye pain, redness, hearing loss, ear pain, congestion, sore throat, rhinorrhea, sneezing, mouth sores, trouble swallowing, neck pain, neck stiffness and tinnitus.   Respiratory: Denies SOB, DOE, cough, chest tightness,  and wheezing.   Cardiovascular: Denies chest pain, palpitations and leg swelling.  Gastrointestinal: Denies nausea, vomiting, abdominal pain, diarrhea, constipation, blood in stool and abdominal distention.  Genitourinary: Denies dysuria, urgency, frequency, hematuria, flank pain and difficulty urinating.  Endocrine: Denies: hot or cold intolerance, sweats, changes in hair or nails, polyuria, polydipsia. Musculoskeletal: Denies myalgias, back pain, joint swelling, arthralgias and gait problem.  Skin: Denies pallor, rash and wound.  Neurological: Denies dizziness, seizures, syncope, weakness, light-headedness, numbness and headaches.  Hematological: Denies adenopathy. Easy bruising, personal or family bleeding history  Psychiatric/Behavioral: Denies suicidal ideation, mood changes, confusion, nervousness, sleep disturbance and agitation    Physical Exam: Vitals:   04/23/19 1000  BP: 102/70  Pulse: 71  Temp: 98.1 F (36.7 C)  TempSrc: Temporal  SpO2: 97%  Weight: 213 lb 9.6 oz (96.9 kg)    Body mass index is 36.47 kg/m.   Constitutional: NAD, calm, comfortable Eyes: PERRL, lids and conjunctivae normal ENMT: Mucous membranes are moist.    Respiratory: clear to auscultation bilaterally, no wheezing, no crackles. Normal respiratory effort. No accessory muscle use.  Cardiovascular: Regular rate and rhythm, no murmurs / rubs / gallops. No extremity edema.  Neurologic: Grossly intact and nonfocal Psychiatric: Normal judgment and insight. Alert and oriented x 3. Normal mood.    Impression and Plan:  IGT (impaired glucose tolerance) -A1c has decreased to 5.9 from 6.2 in 3 months with weight loss and exercise. -She has been congratulated on her success.  Hyperlipidemia, unspecified hyperlipidemia type -Last LDL was 128 in September, not currently on medications.  Other specified hypothyroidism -Last TSH was 4 in September, currently on Synthroid.  Vitamin D deficiency -On high-dose vitamin D supplementation.  Morbid obesity ([place]) -Discussed healthy lifestyle, including increased physical activity and better food choices to promote weight loss. -She has lost 20 pounds since her last visit.   Patient Instructions  -Nice seeing you today!!  -Great job on your weight loss.  -Schedule follow up in 6 months.     Lelon Frohlich, MD [facility]

## 2019-04-23 NOTE — Patient Instructions (Signed)
-  Nice seeing you today!!  -Great job on your weight loss.  -Schedule follow up in 6 months.

## 2019-06-03 ENCOUNTER — Other Ambulatory Visit: Payer: Self-pay

## 2019-06-04 ENCOUNTER — Other Ambulatory Visit (INDEPENDENT_AMBULATORY_CARE_PROVIDER_SITE_OTHER): Payer: 59

## 2019-06-04 DIAGNOSIS — E559 Vitamin D deficiency, unspecified: Secondary | ICD-10-CM

## 2019-06-04 LAB — VITAMIN D 25 HYDROXY (VIT D DEFICIENCY, FRACTURES): VITD: 26.56 ng/mL — ABNORMAL LOW (ref 30.00–100.00)

## 2019-06-06 ENCOUNTER — Other Ambulatory Visit: Payer: Self-pay | Admitting: Internal Medicine

## 2019-06-06 ENCOUNTER — Encounter: Payer: Self-pay | Admitting: Internal Medicine

## 2019-06-06 ENCOUNTER — Other Ambulatory Visit: Payer: Self-pay | Admitting: Family

## 2019-06-06 DIAGNOSIS — E559 Vitamin D deficiency, unspecified: Secondary | ICD-10-CM

## 2019-06-06 MED ORDER — VITAMIN D (ERGOCALCIFEROL) 1.25 MG (50000 UNIT) PO CAPS: 50000.0000 [IU] | ORAL_CAPSULE | ORAL | 0 refills | Status: DC

## 2019-06-06 MED FILL — VIT D2 1.25 MG (50,000 UNIT: 1.25 MG | 35 days supply | Qty: 5 | Fill #0

## 2019-06-09 MED FILL — AZELASTINE HCL 137 MCG SPRY: 0.1 | 25 days supply | Qty: 30 | Fill #1

## 2019-06-13 ENCOUNTER — Encounter: Payer: Self-pay | Admitting: Internal Medicine

## 2019-06-13 DIAGNOSIS — E559 Vitamin D deficiency, unspecified: Secondary | ICD-10-CM

## 2019-06-13 MED ORDER — VITAMIN D (ERGOCALCIFEROL) 1.25 MG (50000 UNIT) PO CAPS: 50000.0000 [IU] | ORAL_CAPSULE | ORAL | 0 refills | Status: DC

## 2019-06-26 ENCOUNTER — Telehealth: Payer: Self-pay | Admitting: Internal Medicine

## 2019-06-26 MED ORDER — LEVONORGESTREL 20 MCG/24HR IU IUD: 1.0000 | INTRAUTERINE_SYSTEM | Freq: Once | INTRAUTERINE | 2 refills | Status: DC

## 2019-06-26 NOTE — Telephone Encounter (Signed)
Refill sent.

## 2019-06-26 NOTE — Telephone Encounter (Signed)
Medication Refill: Levonorgestrel  Pharm[facility]: [facility]  Phone:

## 2019-06-26 NOTE — Telephone Encounter (Signed)
Pt is requesting a refill on Levothyroxine(Synthroid) 50 MCG tablet sent to [facility] on [address] Lynford Humphrey

## 2019-06-30 ENCOUNTER — Encounter: Payer: Self-pay | Admitting: Certified Nurse Midwife

## 2019-06-30 ENCOUNTER — Other Ambulatory Visit: Payer: Self-pay | Admitting: Internal Medicine

## 2019-07-02 ENCOUNTER — Encounter: Payer: Self-pay | Admitting: Certified Nurse Midwife

## 2019-07-14 MED FILL — VIT D2 1.25 MG (50,000 UNIT: 1.25 MG | 84 days supply | Qty: 12 | Fill #0

## 2019-08-20 ENCOUNTER — Ambulatory Visit
Admission: EM | Admit: 2019-08-20 | Discharge: 2019-08-20 | Disposition: A | Discharge status: Home / Self Care | Payer: 59 | Attending: Internal Medicine | Admitting: Internal Medicine

## 2019-08-20 DIAGNOSIS — G43009 Migraine without aura, not intractable, without status migrainosus: Secondary | ICD-10-CM

## 2019-08-20 MED ORDER — KETOROLAC TROMETHAMINE 30 MG/ML IJ SOLN
30.0000 mg | Freq: Once | INTRAMUSCULAR | Status: AC
  Administered 2019-08-20: 30 mg via INTRAMUSCULAR

## 2019-08-20 NOTE — ED Provider Notes (Signed)
[facility]    CSN: [id number] Arrival date & time: 08/20/19  1411      History   Chief Complaint Chief Complaint  Patient presents with  . Migraine    HPI Cheryl Carr is a 29 y.o. female with a history of migraine is seen in the urgent care for migraine headaches.  Headache was of moderate severity and associated with photophobia.  No numbness or tingling no extremity weakness.  Patient has tried Excedrin x2 doses with no improvement.  HPI  Past Medical History:  Diagnosis Date  . Abnormal uterine bleeding   . Prediabetes   . Thyroid disease    Hypo    Patient Active Problem List   Diagnosis Date Noted  . Prediabetes   . Morbid obesity ([place]) 12/18/2018  . Family history of premature CAD 12/18/2018  . Hyperlipidemia 12/18/2018  . Vitamin D deficiency 12/18/2018  . IGT (impaired glucose tolerance) 12/18/2018  . Exercise-induced asthma 11/29/2018  . Allergic rhinitis 11/29/2018  . Hypothyroidism 09/11/2018    History reviewed. No pertinent surgical history.  OB History    Gravida  0   Para  0   Term  0   Preterm  0   AB  0   Living  0     SAB  0   TAB  0   Ectopic  0   Multiple  0   Live Births  0            Home Medications    Prior to Admission medications   Medication Sig Start Date End Date Taking? Authorizing Provider  albuterol (VENTOLIN HFA) 108 (90 Base) MCG/ACT inhaler Take 2 puffs prior to exercise 11/29/18   Isaac Bliss, Rayford Halsted, MD  azelastine (ASTELIN) 0.1 % nasal spray Place 2 sprays into both nostrils 2 (two) times daily. Use in each nostril as directed 11/29/18   Isaac Bliss, Rayford Halsted, MD  cetirizine (ZYRTEC ALLERGY) 10 MG tablet  06/16/18   [provider]  Diethylpropion HCl CR 75 MG TB24 Take 75 mg by mouth daily. 01/10/19   [provider]  levonorgestrel (MIRENA) 20 MCG/24HR IUD 1 Intra Uterine Device (1 each total) by Intrauterine route once for 1 dose. 06/26/19 06/26/19  Isaac Bliss, Rayford Halsted, MD  levothyroxine (SYNTHROID) 50 MCG tablet TAKE ONE TABLET BY MOUTH DAILY BEFORE BREAKFAST 07/01/19   Isaac Bliss, Rayford Halsted, MD  Vitamin D, Ergocalciferol, (DRISDOL) 1.25 MG (50000 UNIT) CAPS capsule Take 1 capsule (50,000 Units total) by mouth every 7 (seven) days. 06/13/19   Isaac Bliss, Rayford Halsted, MD    Family History Family History  Problem Relation Age of Onset  . Breast cancer Maternal Aunt   . Cancer Maternal Grandfather   . Heart disease Paternal Grandfather     Social History Social History   Tobacco Use  . Smoking status: Never Smoker  . Smokeless tobacco: Never Used  Substance Use Topics  . Alcohol use: No  . Drug use: No     Allergies   Patient has no known allergies.   Review of Systems Review of Systems  Constitutional: Negative for activity change, chills, fatigue and fever.  HENT: Negative.   Neurological: Positive for headaches. Negative for dizziness, weakness, light-headedness and numbness.  Psychiatric/Behavioral: Negative for confusion and decreased concentration.     Physical Exam Triage Vital Signs ED Triage Vitals  Enc Vitals Group     BP      Pulse  Resp      Temp      Temp src      SpO2      Weight      Height      Head Circumference      Peak Flow      Pain Score      Pain Loc      Pain Edu?      Excl. in GC?    No data found.  Updated Vital Signs There were no vitals taken for this visit.  Visual Acuity Right Eye Distance:   Left Eye Distance:   Bilateral Distance:    Right Eye Near:   Left Eye Near:    Bilateral Near:     Physical Exam Vitals and nursing note reviewed.  Constitutional:      General: She is in acute distress.     Appearance: She is not ill-appearing.  Cardiovascular:     Rate and Rhythm: Normal rate and regular rhythm.  Skin:    Capillary Refill: Capillary refill takes less than 2 seconds.  Neurological:     General: No focal deficit present.     Mental Status:  She is alert and oriented to person, place, and time. Mental status is at baseline.     Cranial Nerves: No cranial nerve deficit.     Sensory: No sensory deficit.     Gait: Gait normal.     Deep Tendon Reflexes: Reflexes normal.  Psychiatric:        Mood and Affect: Mood normal.        Behavior: Behavior normal.      UC Treatments / Results  Labs (all labs ordered are listed, but only abnormal results are displayed) Labs Reviewed - No data to display  EKG   Radiology No results found.  Procedures Procedures (including critical care time)  Medications Ordered in UC Medications  ketorolac (TORADOL) 30 MG/ML injection 30 mg (30 mg Intramuscular Given 08/20/19 1443)    Initial Impression / Assessment and Plan / UC Course  I have reviewed the triage vital signs and the nursing notes.  Pertinent labs & imaging results that were available during my care of the patient were reviewed by me and considered in my medical decision making (see chart for details).     1.  Acute migraine: Toradol 30 mg IM x1 dose Continue Excedrin as needed Return precautions given Final Clinical Impressions(s) / UC Diagnoses   Final diagnoses:  Migraine without aura and without status migrainosus, not intractable   Discharge Instructions   None    ED Prescriptions    None     PDMP not reviewed this encounter.   Merrilee Jansky, MD 08/21/19 1759

## 2019-08-20 NOTE — ED Triage Notes (Signed)
Pt c/o tension migraine headache starting today with light sensitive.

## 2019-10-22 ENCOUNTER — Other Ambulatory Visit: Payer: Self-pay

## 2019-10-22 ENCOUNTER — Ambulatory Visit: Payer: 59 | Admitting: Internal Medicine

## 2019-10-22 ENCOUNTER — Encounter: Payer: Self-pay | Admitting: Internal Medicine

## 2019-10-22 VITALS — BP 102/70 | HR 75 | Temp 97.7°F | Wt 205.9 lb

## 2019-10-22 DIAGNOSIS — R7303 Prediabetes: Secondary | ICD-10-CM

## 2019-10-22 DIAGNOSIS — J45990 Exercise induced bronchospasm: Secondary | ICD-10-CM | POA: Diagnosis not present

## 2019-10-22 DIAGNOSIS — E559 Vitamin D deficiency, unspecified: Secondary | ICD-10-CM | POA: Diagnosis not present

## 2019-10-22 DIAGNOSIS — E785 Hyperlipidemia, unspecified: Secondary | ICD-10-CM

## 2019-10-22 DIAGNOSIS — E038 Other specified hypothyroidism: Secondary | ICD-10-CM | POA: Diagnosis not present

## 2019-10-22 DIAGNOSIS — J309 Allergic rhinitis, unspecified: Secondary | ICD-10-CM | POA: Diagnosis not present

## 2019-10-22 DIAGNOSIS — R7302 Impaired glucose tolerance (oral): Secondary | ICD-10-CM

## 2019-10-22 LAB — POCT GLYCOSYLATED HEMOGLOBIN (HGB A1C): Hemoglobin A1C: 5.4 % (ref 4.0–5.6)

## 2019-10-22 MED ORDER — AZELASTINE HCL 0.1 % NA SOLN: 2.0000 | Freq: Two times a day (BID) | NASAL | 3 refills | Status: DC

## 2019-10-22 MED ORDER — ALBUTEROL SULFATE HFA 108 (90 BASE) MCG/ACT IN AERS: INHALATION_SPRAY | RESPIRATORY_TRACT | 3 refills | Status: DC

## 2019-10-22 MED FILL — ALBUTEROL SULFATE HFA 108 (: 108 (90 BAS | 25 days supply | Qty: 9 | Fill #1

## 2019-10-22 NOTE — Progress Notes (Signed)
Established Patient Office Visit     This visit occurred during the SARS-CoV-2 public health emergency.  Safety protocols were in place, including screening questions prior to the visit, additional usage of staff PPE, and extensive cleaning of exam room while observing appropriate contact time as indicated for disinfecting solutions.    CC/Reason for Visit: 29-month follow-up chronic conditions  HPI: Cheryl Carr is a 29 y.o. female who is coming in today for the above mentioned reasons. Past Medical History is significant for:  Hypothyroidism well-controlled on Synthroid, morbid obesity, exercise-induced asthma, vitamin D deficiency and recently diagnosed impaired glucose tolerance.  I last saw her in September at which time she had an A1c of 6.2.  She was asked to follow a low-fat diet and to attempt mild to moderate weight loss.  She has lost close to 30 pounds with exercise and healthy dieting.  She is here to have her A1c rechecked today.  She has no complaints.   Past Medical/Surgical History: Past Medical History:  Diagnosis Date   Abnormal uterine bleeding    Prediabetes    Thyroid disease    Hypo    No past surgical history on file.  Social History:  reports that she has never smoked. She has never used smokeless tobacco. She reports that she does not drink alcohol and does not use drugs.  Allergies: No Known Allergies  Family History:  Family History  Problem Relation Age of Onset   Breast cancer Maternal Aunt    Cancer Maternal Grandfather    Heart disease Paternal Grandfather      Current Outpatient Medications:    albuterol (VENTOLIN HFA) 108 (90 Base) MCG/ACT inhaler, Take 2 puffs prior to exercise, Disp: 6.7 g, Rfl: 3   azelastine (ASTELIN) 0.1 % nasal spray, Place 2 sprays into both nostrils 2 (two) times daily. Use in each nostril as directed, Disp: 30 mL, Rfl: 3   cetirizine (ZYRTEC ALLERGY) 10 MG tablet, , Disp: , Rfl:    Diethylpropion HCl CR  75 MG TB24, Take 75 mg by mouth daily., Disp: , Rfl:    levonorgestrel (MIRENA) 20 MCG/24HR IUD, 1 Intra Uterine Device (1 each total) by Intrauterine route once for 1 dose., Disp: 1 each, Rfl: 2   levothyroxine (SYNTHROID) 50 MCG tablet, TAKE ONE TABLET BY MOUTH DAILY BEFORE BREAKFAST, Disp: 90 tablet, Rfl: 1   Vitamin D, Ergocalciferol, (DRISDOL) 1.25 MG (50000 UNIT) CAPS capsule, Take 1 capsule (50,000 Units total) by mouth every 7 (seven) days., Disp: 12 capsule, Rfl: 0  Review of Systems:  Constitutional: Denies fever, chills, diaphoresis, appetite change and fatigue.  HEENT: Denies photophobia, eye pain, redness, hearing loss, ear pain, congestion, sore throat, rhinorrhea, sneezing, mouth sores, trouble swallowing, neck pain, neck stiffness and tinnitus.   Respiratory: Denies SOB, DOE, cough, chest tightness,  and wheezing.   Cardiovascular: Denies chest pain, palpitations and leg swelling.  Gastrointestinal: Denies nausea, vomiting, abdominal pain, diarrhea, constipation, blood in stool and abdominal distention.  Genitourinary: Denies dysuria, urgency, frequency, hematuria, flank pain and difficulty urinating.  Endocrine: Denies: hot or cold intolerance, sweats, changes in hair or nails, polyuria, polydipsia. Musculoskeletal: Denies myalgias, back pain, joint swelling, arthralgias and gait problem.  Skin: Denies pallor, rash and wound.  Neurological: Denies dizziness, seizures, syncope, weakness, light-headedness, numbness and headaches.  Hematological: Denies adenopathy. Easy bruising, personal or family bleeding history  Psychiatric/Behavioral: Denies suicidal ideation, mood changes, confusion, nervousness, sleep disturbance and agitation    Physical Exam: Vitals:  10/22/19 0901  BP: 102/70  Pulse: 75  Temp: 97.7 F (36.5 C)  TempSrc: Temporal  SpO2: 95%  Weight: 205 lb 14.4 oz (93.4 kg)    Body mass index is 35.16 kg/m.   Constitutional: NAD, calm,  comfortable Eyes: PERRL, lids and conjunctivae normal ENMT: Mucous membranes are moist.  Respiratory: clear to auscultation bilaterally, no wheezing, no crackles. Normal respiratory effort. No accessory muscle use.  Cardiovascular: Regular rate and rhythm, no murmurs / rubs / gallops. No extremity edema.  Neurologic: Grossly intact and nonfocal Psychiatric: Normal judgment and insight. Alert and oriented x 3. Normal mood.    Impression and Plan:  IGT (impaired glucose tolerance) -A1c back to normal range at 5.4 today on account of weight loss  Exercise-induced asthma  - Plan: albuterol (VENTOLIN HFA) 108 (90 Base) MCG/ACT inhaler  Allergic rhinitis, unspecified seasonality, unspecified trigger  - Plan: azelastine (ASTELIN) 0.1 % nasal spray  Morbid obesity (HCC) -She has been congratulated on her weight loss and encouraged to continue.  Other specified hypothyroidism -Last TSH was 4 in September, continue Synthroid, repeat TSH when she returns for physical in 3 months.  Vitamin D deficiency -Recheck vitamin D levels when she returns for her annual physical.  Hyperlipidemia, unspecified hyperlipidemia type -Last LDL was 120 in September. -She is not on medications, I suspect improved with weight loss, recheck lipids when she returns for her annual physical.    Patient Instructions  -Nice seeing you today!!  -See you back in September for your physical. Please come in fasting.     [name], MD [facility]

## 2019-10-22 NOTE — Patient Instructions (Signed)
-  Nice seeing you today!!  -See you back in September for your physical. Please come in fasting.

## 2019-11-19 DIAGNOSIS — H5213 Myopia, bilateral: Secondary | ICD-10-CM | POA: Diagnosis not present

## 2019-12-23 ENCOUNTER — Telehealth: Payer: Self-pay | Admitting: Internal Medicine

## 2019-12-23 ENCOUNTER — Encounter: Payer: 59 | Admitting: Internal Medicine

## 2019-12-23 NOTE — Telephone Encounter (Signed)
Pt is calling to get her medication   levothyroxine (SYNTHROID) 50 MCG tablet   Refilled.   Cheryl [facility] [address], [place] - [address]  [address], [place] [address]  Phone:  [phone] Fax:  [phone]

## 2019-12-24 MED ORDER — LEVOTHYROXINE SODIUM 50 MCG PO TABS: ORAL_TABLET | ORAL | 1 refills | Status: DC

## 2019-12-24 NOTE — Telephone Encounter (Signed)
Refill sent.

## 2020-02-12 ENCOUNTER — Ambulatory Visit: Payer: 59 | Admitting: Internal Medicine

## 2020-02-12 ENCOUNTER — Other Ambulatory Visit: Payer: Self-pay

## 2020-02-12 VITALS — BP 108/50 | HR 80 | Temp 98.7°F | Ht 64.0 in | Wt 203.4 lb

## 2020-02-12 DIAGNOSIS — Z0000 Encounter for general adult medical examination without abnormal findings: Secondary | ICD-10-CM

## 2020-02-12 DIAGNOSIS — R7302 Impaired glucose tolerance (oral): Secondary | ICD-10-CM

## 2020-02-12 DIAGNOSIS — E785 Hyperlipidemia, unspecified: Secondary | ICD-10-CM | POA: Diagnosis not present

## 2020-02-12 DIAGNOSIS — E038 Other specified hypothyroidism: Secondary | ICD-10-CM

## 2020-02-12 DIAGNOSIS — J45990 Exercise induced bronchospasm: Secondary | ICD-10-CM | POA: Diagnosis not present

## 2020-02-12 DIAGNOSIS — E559 Vitamin D deficiency, unspecified: Secondary | ICD-10-CM | POA: Diagnosis not present

## 2020-02-12 NOTE — Progress Notes (Signed)
Established Patient Office Visit     This visit occurred during the SARS-CoV-2 public health emergency.  Safety protocols were in place, including screening questions prior to the visit, additional usage of staff PPE, and extensive cleaning of exam room while observing appropriate contact time as indicated for disinfecting solutions.    CC/Reason for Visit: Annual preventive exam  HPI: Cheryl Carr is a 29 y.o. female who is coming in today for the above mentioned reasons. Past Medical History is significant for: Hypothyroidism on levothyroxine, exercise-induced asthma, vitamin D deficiency and impaired glucose tolerance.  She has been doing well with lifestyle modifications and has lost an additional 3 pounds since last visit.  She has routine eye and dental care.  She will return for labs as she is not fasting today.  She has received her flu vaccine and her Covid booster.  She has no acute complaints today.   Past Medical/Surgical History: Past Medical History:  Diagnosis Date  . Abnormal uterine bleeding   . Prediabetes   . Thyroid disease    Hypo    No past surgical history on file.  Social History:  reports that she has never smoked. She has never used smokeless tobacco. She reports that she does not drink alcohol and does not use drugs.  Allergies: No Known Allergies  Family History:  Family History  Problem Relation Age of Onset  . Breast cancer Maternal Aunt   . Cancer Maternal Grandfather   . Heart disease Paternal Grandfather      Current Outpatient Medications:  .  albuterol (VENTOLIN HFA) 108 (90 Base) MCG/ACT inhaler, Take 2 puffs prior to exercise, Disp: 6.7 g, Rfl: 3 .  azelastine (ASTELIN) 0.1 % nasal spray, Place 2 sprays into both nostrils 2 (two) times daily. Use in each nostril as directed, Disp: 30 mL, Rfl: 3 .  cetirizine (ZYRTEC ALLERGY) 10 MG tablet, , Disp: , Rfl:  .  Diethylpropion HCl CR 75 MG TB24, Take 75 mg by mouth daily., Disp: , Rfl:  .   levonorgestrel (MIRENA) 20 MCG/24HR IUD, 1 Intra Uterine Device (1 each total) by Intrauterine route once for 1 dose., Disp: 1 each, Rfl: 2 .  levothyroxine (SYNTHROID) 50 MCG tablet, TAKE ONE TABLET BY MOUTH DAILY BEFORE BREAKFAST, Disp: 90 tablet, Rfl: 1 .  Vitamin D, Ergocalciferol, (DRISDOL) 1.25 MG (50000 UNIT) CAPS capsule, Take 1 capsule (50,000 Units total) by mouth every 7 (seven) days., Disp: 12 capsule, Rfl: 0  Review of Systems:  Constitutional: Denies fever, chills, diaphoresis, appetite change and fatigue.  HEENT: Denies photophobia, eye pain, redness, hearing loss, ear pain, congestion, sore throat, rhinorrhea, sneezing, mouth sores, trouble swallowing, neck pain, neck stiffness and tinnitus.   Respiratory: Denies SOB, DOE, cough, chest tightness,  and wheezing.   Cardiovascular: Denies chest pain, palpitations and leg swelling.  Gastrointestinal: Denies nausea, vomiting, abdominal pain, diarrhea, constipation, blood in stool and abdominal distention.  Genitourinary: Denies dysuria, urgency, frequency, hematuria, flank pain and difficulty urinating.  Endocrine: Denies: hot or cold intolerance, sweats, changes in hair or nails, polyuria, polydipsia. Musculoskeletal: Denies myalgias, back pain, joint swelling, arthralgias and gait problem.  Skin: Denies pallor, rash and wound.  Neurological: Denies dizziness, seizures, syncope, weakness, light-headedness, numbness and headaches.  Hematological: Denies adenopathy. Easy bruising, personal or family bleeding history  Psychiatric/Behavioral: Denies suicidal ideation, mood changes, confusion, nervousness, sleep disturbance and agitation    Physical Exam: Vitals:   02/12/20 1404  BP: (!) 108/50  Pulse: 80  Temp: 98.7 F (37.1 C)  TempSrc: Oral  SpO2: 98%  Weight: 203 lb 6.4 oz (92.3 kg)  Height: 5\' 4"  (1.626 m)    Body mass index is 34.91 kg/m.   Constitutional: NAD, calm, comfortable Eyes: PERRL, lids and conjunctivae  normal ENMT: Mucous membranes are moist. Posterior pharynx clear of any exudate or lesions. Normal dentition. Tympanic membrane is pearly white, no erythema or bulging. Neck: normal, supple, no masses, no thyromegaly Respiratory: clear to auscultation bilaterally, no wheezing, no crackles. Normal respiratory effort. No accessory muscle use.  Cardiovascular: Regular rate and rhythm, no murmurs / rubs / gallops. No extremity edema.  Abdomen: no tenderness, no masses palpated. No hepatosplenomegaly. Bowel sounds positive.  Musculoskeletal: no clubbing / cyanosis. No joint deformity upper and lower extremities. Good ROM, no contractures. Normal muscle tone.  Skin: no rashes, lesions, ulcers. No induration Neurologic: CN 2-12 grossly intact. Sensation intact, DTR normal. Strength 5/5 in all 4.  Psychiatric: Normal judgment and insight. Alert and oriented x 3. Normal mood.    Impression and Plan:  Encounter for preventive health examination  -She has routine eye and dental care. -All immunizations are up-to-date. -She will return for labs as she is not fasting today. -Healthy lifestyle discussed in detail. -Commence routine breast cancer screening age 71, colon cancer screening age 71.  She has a GYN, last Pap smear was in 2019.  IGT (impaired glucose tolerance)  - Plan: Hemoglobin A1c  Other specified hypothyroidism -Check TSH. -Last TSH was 20 December 2018.  Hyperlipidemia, unspecified hyperlipidemia type  - Plan: Lipid panel -Last LDL was 120 in September 2020.  She is not on statin therapy.  Vitamin D deficiency  - Plan: VITAMIN D 25 Hydroxy (Vit-D Deficiency, Fractures)  Exercise-induced asthma -Continue as needed albuterol inhaler.    October 2020, MD [facility] at [facility]

## 2020-02-19 ENCOUNTER — Other Ambulatory Visit: Payer: 59

## 2020-02-19 ENCOUNTER — Other Ambulatory Visit: Payer: Self-pay

## 2020-02-19 DIAGNOSIS — R7302 Impaired glucose tolerance (oral): Secondary | ICD-10-CM | POA: Diagnosis not present

## 2020-02-19 DIAGNOSIS — E038 Other specified hypothyroidism: Secondary | ICD-10-CM | POA: Diagnosis not present

## 2020-02-19 DIAGNOSIS — Z0000 Encounter for general adult medical examination without abnormal findings: Secondary | ICD-10-CM | POA: Diagnosis not present

## 2020-02-19 DIAGNOSIS — E785 Hyperlipidemia, unspecified: Secondary | ICD-10-CM

## 2020-02-19 DIAGNOSIS — E559 Vitamin D deficiency, unspecified: Secondary | ICD-10-CM | POA: Diagnosis not present

## 2020-02-20 LAB — VITAMIN B12: Vitamin B-12: 727 pg/mL (ref 200–1100)

## 2020-02-20 LAB — COMPREHENSIVE METABOLIC PANEL
AG Ratio: 1.2 (calc) (ref 1.0–2.5)
ALT: 31 U/L — ABNORMAL HIGH (ref 6–29)
AST: 32 U/L — ABNORMAL HIGH (ref 10–30)
Albumin: 4.1 g/dL (ref 3.6–5.1)
Alkaline phosphatase (APISO): 87 U/L (ref 31–125)
BUN: 14 mg/dL (ref 7–25)
CO2: 23 mmol/L (ref 20–32)
Calcium: 9.5 mg/dL (ref 8.6–10.2)
Chloride: 106 mmol/L (ref 98–110)
Creat: 0.65 mg/dL (ref 0.50–1.10)
Globulin: 3.3 g/dL (calc) (ref 1.9–3.7)
Glucose, Bld: 98 mg/dL (ref 65–99)
Potassium: 4.6 mmol/L (ref 3.5–5.3)
Sodium: 139 mmol/L (ref 135–146)
Total Bilirubin: 0.4 mg/dL (ref 0.2–1.2)
Total Protein: 7.4 g/dL (ref 6.1–8.1)

## 2020-02-20 LAB — LIPID PANEL
Cholesterol: 197 mg/dL (ref <200)
HDL: 55 mg/dL (ref >=50)
LDL Cholesterol (Calc): 125 mg/dL (calc) — ABNORMAL HIGH
Non-HDL Cholesterol (Calc): 142 mg/dL (calc) — ABNORMAL HIGH (ref <130)
Total CHOL/HDL Ratio: 3.6 (calc) (ref <5.0)
Triglycerides: 80 mg/dL (ref <150)

## 2020-02-20 LAB — CBC WITH DIFFERENTIAL/PLATELET
Absolute Monocytes: 462 cells/uL (ref 200–950)
Basophils Absolute: 49 cells/uL (ref 0–200)
Basophils Relative: 0.7 %
Eosinophils Absolute: 91 cells/uL (ref 15–500)
Eosinophils Relative: 1.3 %
HCT: 42.8 % (ref 35.0–45.0)
Hemoglobin: 13.8 g/dL (ref 11.7–15.5)
Lymphs Abs: 2240 cells/uL (ref 850–3900)
MCH: 28 pg (ref 27.0–33.0)
MCHC: 32.2 g/dL (ref 32.0–36.0)
MCV: 87 fL (ref 80.0–100.0)
MPV: 10 fL (ref 7.5–12.5)
Monocytes Relative: 6.6 %
Neutro Abs: 4158 cells/uL (ref 1500–7800)
Neutrophils Relative %: 59.4 %
Platelets: 439 10*3/uL — ABNORMAL HIGH (ref 140–400)
RBC: 4.92 10*6/uL (ref 3.80–5.10)
RDW: 12.2 % (ref 11.0–15.0)
Total Lymphocyte: 32 %
WBC: 7 10*3/uL (ref 3.8–10.8)

## 2020-02-20 LAB — HEMOGLOBIN A1C
Hgb A1c MFr Bld: 5.8 % of total Hgb — ABNORMAL HIGH (ref <5.7)
Mean Plasma Glucose: 120 (calc)
eAG (mmol/L): 6.6 (calc)

## 2020-02-20 LAB — TSH: TSH: 5.43 mIU/L — ABNORMAL HIGH

## 2020-02-20 LAB — VITAMIN D 25 HYDROXY (VIT D DEFICIENCY, FRACTURES): Vit D, 25-Hydroxy: 20 ng/mL — ABNORMAL LOW (ref 30–100)

## 2020-02-25 ENCOUNTER — Other Ambulatory Visit: Payer: Self-pay | Admitting: Internal Medicine

## 2020-02-25 ENCOUNTER — Encounter: Payer: Self-pay | Admitting: Internal Medicine

## 2020-02-25 DIAGNOSIS — R7401 Elevation of levels of liver transaminase levels: Secondary | ICD-10-CM

## 2020-02-25 DIAGNOSIS — E559 Vitamin D deficiency, unspecified: Secondary | ICD-10-CM

## 2020-02-25 DIAGNOSIS — E785 Hyperlipidemia, unspecified: Secondary | ICD-10-CM

## 2020-02-25 DIAGNOSIS — E038 Other specified hypothyroidism: Secondary | ICD-10-CM

## 2020-02-25 MED ORDER — VITAMIN D (ERGOCALCIFEROL) 1.25 MG (50000 UNIT) PO CAPS: 50000.0000 [IU] | ORAL_CAPSULE | ORAL | 0 refills | Status: AC

## 2020-02-25 MED ORDER — ATORVASTATIN CALCIUM 10 MG PO TABS: 10.0000 mg | ORAL_TABLET | Freq: Every day | ORAL | 1 refills | Status: DC

## 2020-02-25 MED ORDER — LEVOTHYROXINE SODIUM 75 MCG PO TABS: 75.0000 ug | ORAL_TABLET | Freq: Every day | ORAL | 1 refills | Status: DC

## 2020-02-25 MED FILL — LEVOTHYROXINE 75 MCG TABLET: 75 | 90 days supply | Qty: 90 | Fill #0

## 2020-02-25 MED FILL — ATORVASTATIN CALCIUM 10 MG: 10 | 90 days supply | Qty: 90 | Fill #0

## 2020-02-25 MED FILL — VIT D2 1.25 MG (50,000 UNIT: 1.25 MG | 84 days supply | Qty: 12 | Fill #0

## 2020-02-26 ENCOUNTER — Other Ambulatory Visit: Payer: Self-pay | Admitting: Internal Medicine

## 2020-02-26 DIAGNOSIS — R7989 Other specified abnormal findings of blood chemistry: Secondary | ICD-10-CM

## 2020-02-26 DIAGNOSIS — R7401 Elevation of levels of liver transaminase levels: Secondary | ICD-10-CM

## 2020-02-26 DIAGNOSIS — E559 Vitamin D deficiency, unspecified: Secondary | ICD-10-CM

## 2020-02-26 DIAGNOSIS — E785 Hyperlipidemia, unspecified: Secondary | ICD-10-CM

## 2020-03-21 ENCOUNTER — Encounter: Payer: Self-pay | Admitting: Internal Medicine

## 2020-03-23 ENCOUNTER — Other Ambulatory Visit: Payer: Self-pay | Admitting: Internal Medicine

## 2020-03-23 DIAGNOSIS — Z309 Encounter for contraceptive management, unspecified: Secondary | ICD-10-CM

## 2020-03-23 NOTE — Telephone Encounter (Signed)
Patient called and wanted to see if she needed to schedule a lab appointment to get a pregnancy test or could it be done the same day when she gets the birth control, please advise. CB is [phone]

## 2020-03-31 ENCOUNTER — Other Ambulatory Visit: Payer: Self-pay

## 2020-03-31 ENCOUNTER — Other Ambulatory Visit (INDEPENDENT_AMBULATORY_CARE_PROVIDER_SITE_OTHER): Payer: 59

## 2020-03-31 ENCOUNTER — Telehealth: Payer: Self-pay

## 2020-03-31 ENCOUNTER — Other Ambulatory Visit: Payer: Self-pay | Admitting: Internal Medicine

## 2020-03-31 DIAGNOSIS — Z309 Encounter for contraceptive management, unspecified: Secondary | ICD-10-CM

## 2020-03-31 LAB — POCT URINE PREGNANCY: Preg Test, Ur: NEGATIVE

## 2020-03-31 MED ORDER — ETONOGESTREL-ETHINYL ESTRADIOL 0.12-0.015 MG/24HR VA RING: VAGINAL_RING | VAGINAL | 12 refills | Status: DC

## 2020-03-31 MED FILL — ETONOGESTREL-ETHINYL ESTRAD: 0.12-0.015 | 84 days supply | Qty: 3 | Fill #0

## 2020-03-31 NOTE — Telephone Encounter (Signed)
New message    The patient at front desk asking for new prescription to be called in to pharmacy today if possible due to travels on Saturday  12.18.21.  [facility]

## 2020-04-01 ENCOUNTER — Other Ambulatory Visit: Payer: Self-pay | Admitting: Internal Medicine

## 2020-04-01 DIAGNOSIS — J45990 Exercise induced bronchospasm: Secondary | ICD-10-CM

## 2020-04-01 MED FILL — ALBUTEROL SULFATE HFA 108 (: 108 (90 BAS | 30 days supply | Qty: 9 | Fill #0

## 2020-06-07 NOTE — Progress Notes (Signed)
30 y.o. G0P0000 Single Asian Not Hispanic or Latino female here for annual exam.  Patient states that her period can last any where from 7-21 days. She has a mirena IUD, placed in 7/19.  She has 1-2 cycles a month. Bleeding for 1-3 weeks a month, most of it is spotting, only 2-3 days of bleeding.  She tried a nuvaring for the last 2 months. It has helped a little.  She didn't like OCP's in the past, she notices some increase in food cravings.  Period Duration (Days): 7-21 Period Pattern: (!) Irregular Menstrual Flow: Light Menstrual Control: Tampon Menstrual Control Change Freq (Hours): 2-3 Dysmenorrhea: None   She is down 30 lbs since her last visit here in 9/20. She is still loosing weight slowly. She has a Systems analyst. Eating healthy.   H/O hypothyroidism, recent change in her dose of synthroid  H/O prediabetes, last HgbA1C was 5.8%.  Patient's last menstrual period was 06/02/2020.          Sexually active: No.  The current method of family planning is IUD.    Exercising: Yes.    personal training  Smoker:  no  Health Maintenance: Pap: 11/29/2017 WNL, 11/24/14 WNL  History of abnormal Pap:  no MMG:  None  BMD:   None  Colonoscopy: none  TDaP:  12/17/17  Gardasil: complete    reports that she has never smoked. She has never used smokeless tobacco. She reports that she does not drink alcohol and does not use drugs. 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 This is a better work, life balance.   Past Medical History:  Diagnosis Date  . Abnormal uterine bleeding   . Prediabetes   . Thyroid disease    Hypo    History reviewed. No pertinent surgical history.  Current Outpatient Medications  Medication Sig Dispense Refill  . albuterol (VENTOLIN HFA) 108 (90 Base) MCG/ACT inhaler INHALE 2 PUFFS BY MOUTH INTO THE LUNGS PRIOR TO EXERCISE 8.5 g 3  . atorvastatin (LIPITOR) 10 MG tablet Take 1 tablet (10 mg total) by mouth daily. 90 tablet 1  . azelastine (ASTELIN) 0.1 % nasal  spray Place 2 sprays into both nostrils 2 (two) times daily. Use in each nostril as directed 30 mL 3  . cetirizine (ZYRTEC) 10 MG tablet     . etonogestrel-ethinyl estradiol (NUVARING) 0.12-0.015 MG/24HR vaginal ring Insert vaginally and leave in place for 3 consecutive weeks, then remove for 1 week. 1 each 12  . levothyroxine (SYNTHROID) 75 MCG tablet Take 1 tablet (75 mcg total) by mouth daily. 90 tablet 1  . levonorgestrel (MIRENA) 20 MCG/24HR IUD 1 Intra Uterine Device (1 each total) by Intrauterine route once for 1 dose. 1 each 2   No current facility-administered medications for this visit.    Family History  Problem Relation Age of Onset  . Breast cancer Maternal Aunt   . Cancer Maternal Grandfather   . Heart disease Paternal Grandfather     Review of Systems  All other systems reviewed and are negative.   Exam:   BP 110/72   Pulse 100   Ht 5\' 4"  (1.626 m)   Wt 203 lb 3.2 oz (92.2 kg)   LMP 06/02/2020   SpO2 90%   BMI 34.88 kg/m   Weight change: @WEIGHTCHANGE @ Height:   Height: 5\' 4"  (162.6 cm)  Ht Readings from Last 3 Encounters:  06/08/20 5\' 4"  (1.626 m)  02/12/20 5\' 4"  (1.626 m)  01/15/19 5' 4.17" (  1.63 m)    General appearance: alert, cooperative and appears stated age Head: Normocephalic, without obvious abnormality, atraumatic Neck: no adenopathy, supple, symmetrical, trachea midline and thyroid normal to inspection and palpation Lungs: clear to auscultation bilaterally Cardiovascular: regular rate and rhythm Breasts: normal appearance, no masses or tenderness Abdomen: soft, non-tender; non distended,  no masses,  no organomegaly Extremities: extremities normal, atraumatic, no cyanosis or edema Skin: Skin color, texture, turgor normal. No rashes or lesions Lymph nodes: Cervical, supraclavicular, and axillary nodes normal. No abnormal inguinal nodes palpated Neurologic: Grossly normal   Pelvic: External genitalia:  no lesions              Urethra:  normal  appearing urethra with no masses, tenderness or lesions              Bartholins and Skenes: normal                 Vagina: normal appearing vagina with normal color and discharge, no lesions              Cervix: no lesions and iud string 3 cm               Bimanual Exam:  Uterus:  normal size, contour, position, consistency, mobility, non-tender              Adnexa: no mass, fullness, tenderness               Rectovaginal: Confirms               Anus:  normal sphincter tone, no lesions  1. Well woman exam Discussed breast self exam Discussed calcium and vit D intake Labs with primary  2. Screening for cervical cancer - Cytology - PAP with HPV  3. IUD check up IUD strings are fine  4. Breakthrough bleeding with IUD S/p 2 of 3 months of the nuvaring (primary gave it to her). If her abnormal bleeding persists she will let me know.

## 2020-06-08 ENCOUNTER — Encounter: Payer: Self-pay | Admitting: Obstetrics and Gynecology

## 2020-06-08 ENCOUNTER — Other Ambulatory Visit: Payer: Self-pay

## 2020-06-08 ENCOUNTER — Ambulatory Visit (INDEPENDENT_AMBULATORY_CARE_PROVIDER_SITE_OTHER): Payer: Commercial Managed Care - PPO | Admitting: Obstetrics and Gynecology

## 2020-06-08 ENCOUNTER — Other Ambulatory Visit (HOSPITAL_COMMUNITY)
Admission: RE | Admit: 2020-06-08 | Discharge: 2020-06-08 | Disposition: A | Discharge status: Home / Self Care | Payer: Commercial Managed Care - PPO | Source: Ambulatory Visit | Attending: Obstetrics and Gynecology | Admitting: Obstetrics and Gynecology

## 2020-06-08 VITALS — BP 110/72 | HR 100 | Ht 64.0 in | Wt 203.2 lb

## 2020-06-08 DIAGNOSIS — Z975 Presence of (intrauterine) contraceptive device: Secondary | ICD-10-CM

## 2020-06-08 DIAGNOSIS — Z124 Encounter for screening for malignant neoplasm of cervix: Secondary | ICD-10-CM | POA: Insufficient documentation

## 2020-06-08 DIAGNOSIS — Z30431 Encounter for routine checking of intrauterine contraceptive device: Secondary | ICD-10-CM | POA: Diagnosis not present

## 2020-06-08 DIAGNOSIS — Z01419 Encounter for gynecological examination (general) (routine) without abnormal findings: Secondary | ICD-10-CM

## 2020-06-08 DIAGNOSIS — N921 Excessive and frequent menstruation with irregular cycle: Secondary | ICD-10-CM

## 2020-06-08 NOTE — Patient Instructions (Signed)

## 2020-06-09 LAB — CYTOLOGY - PAP
Comment: NEGATIVE
Diagnosis: NEGATIVE
High risk HPV: NEGATIVE

## 2020-08-05 ENCOUNTER — Other Ambulatory Visit: Payer: Self-pay

## 2020-08-06 ENCOUNTER — Ambulatory Visit (INDEPENDENT_AMBULATORY_CARE_PROVIDER_SITE_OTHER): Payer: Commercial Managed Care - PPO | Admitting: Internal Medicine

## 2020-08-06 ENCOUNTER — Other Ambulatory Visit: Payer: Self-pay | Admitting: Internal Medicine

## 2020-08-06 VITALS — BP 110/80 | HR 77 | Temp 98.0°F | Wt 206.6 lb

## 2020-08-06 DIAGNOSIS — R7401 Elevation of levels of liver transaminase levels: Secondary | ICD-10-CM

## 2020-08-06 DIAGNOSIS — R7303 Prediabetes: Secondary | ICD-10-CM | POA: Diagnosis not present

## 2020-08-06 DIAGNOSIS — E038 Other specified hypothyroidism: Secondary | ICD-10-CM

## 2020-08-06 DIAGNOSIS — E559 Vitamin D deficiency, unspecified: Secondary | ICD-10-CM | POA: Diagnosis not present

## 2020-08-06 DIAGNOSIS — E785 Hyperlipidemia, unspecified: Secondary | ICD-10-CM

## 2020-08-06 LAB — COMPREHENSIVE METABOLIC PANEL
ALT: 20 U/L (ref 0–35)
AST: 13 U/L (ref 0–37)
Albumin: 3.9 g/dL (ref 3.5–5.2)
Alkaline Phosphatase: 91 U/L (ref 39–117)
BUN: 16 mg/dL (ref 6–23)
CO2: 27 mEq/L (ref 19–32)
Calcium: 9.2 mg/dL (ref 8.4–10.5)
Chloride: 105 mEq/L (ref 96–112)
Creatinine, Ser: 0.65 mg/dL (ref 0.40–1.20)
GFR: 116.87 mL/min (ref >60.00)
Glucose, Bld: 89 mg/dL (ref 70–99)
Potassium: 4.8 mEq/L (ref 3.5–5.1)
Sodium: 139 mEq/L (ref 135–145)
Total Bilirubin: 0.5 mg/dL (ref 0.2–1.2)
Total Protein: 6.8 g/dL (ref 6.0–8.3)

## 2020-08-06 LAB — LIPID PANEL
Cholesterol: 141 mg/dL (ref 0–200)
HDL: 57.8 mg/dL (ref >39.00)
LDL Cholesterol: 68 mg/dL (ref 0–99)
NonHDL: 83.12
Total CHOL/HDL Ratio: 2
Triglycerides: 74 mg/dL (ref 0.0–149.0)
VLDL: 14.8 mg/dL (ref 0.0–40.0)

## 2020-08-06 LAB — POCT GLYCOSYLATED HEMOGLOBIN (HGB A1C): Hemoglobin A1C: 5.7 % — AB (ref 4.0–5.6)

## 2020-08-06 LAB — VITAMIN D 25 HYDROXY (VIT D DEFICIENCY, FRACTURES): VITD: 15.04 ng/mL — ABNORMAL LOW (ref 30.00–100.00)

## 2020-08-06 LAB — TSH: TSH: 2.08 u[IU]/mL (ref 0.35–4.50)

## 2020-08-06 MED ORDER — VITAMIN D (ERGOCALCIFEROL) 1.25 MG (50000 UNIT) PO CAPS: 50000.0000 [IU] | ORAL_CAPSULE | ORAL | 0 refills | Status: AC

## 2020-08-06 NOTE — Progress Notes (Signed)
Established Patient Office Visit     This visit occurred during the SARS-CoV-2 public health emergency.  Safety protocols were in place, including screening questions prior to the visit, additional usage of staff PPE, and extensive cleaning of exam room while observing appropriate contact time as indicated for disinfecting solutions.    CC/Reason for Visit: 62-month follow-up chronic medical conditions  HPI: Cheryl Carr is a 30 y.o. female who is coming in today for the above mentioned reasons. Past Medical History is significant for: Hypothyroidism on levothyroxine, exercise-induced asthma, vitamin D deficiency and impaired glucose tolerance.  During her lab work for her physical therapy she was found to have transaminitis, hyperlipidemia and a high TSH.  She was started on low-dose Lipitor, levothyroxine dose was adjusted and she was prescribed high-dose vitamin D.  She is here today to follow-up.  She has been doing well and has no acute complaints.  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   Past Medical/Surgical History: Past Medical History:  Diagnosis Date  . Abnormal uterine bleeding   . Prediabetes   . Thyroid disease    Hypo    No past surgical history on file.  Social History:  reports that she has never smoked. She has never used smokeless tobacco. She reports that she does not drink alcohol and does not use drugs.  Allergies: No Known Allergies  Family History:  Family History  Problem Relation Age of Onset  . Breast cancer Maternal Aunt   . Cancer Maternal Grandfather   . Heart disease Paternal Grandfather      Current Outpatient Medications:  .  albuterol (VENTOLIN HFA) 108 (90 Base) MCG/ACT inhaler, INHALE 2 PUFFS BY MOUTH INTO THE LUNGS PRIOR TO EXERCISE, Disp: 8.5 g, Rfl: 3 .  atorvastatin (LIPITOR) 10 MG tablet, Take 1 tablet (10 mg total) by mouth daily., Disp: 90 tablet, Rfl: 1 .  azelastine (ASTELIN) 0.1 % nasal spray,  Place 2 sprays into both nostrils 2 (two) times daily. Use in each nostril as directed, Disp: 30 mL, Rfl: 3 .  cetirizine (ZYRTEC) 10 MG tablet, , Disp: , Rfl:  .  etonogestrel-ethinyl estradiol (NUVARING) 0.12-0.015 MG/24HR vaginal ring, INSERT VAGINALLY AND LEAVE IN PLACE FOR 3 CONSECUTIVE WEEKS, THEN REMOVE FOR 1 WEEK. (Patient taking differently: INSERT VAGINALLY AND LEAVE IN PLACE FOR 3 CONSECUTIVE WEEKS, THEN REMOVE FOR 1 WEEK.), Disp: 1 each, Rfl: 12 .  levothyroxine (SYNTHROID) 75 MCG tablet, Take 1 tablet (75 mcg total) by mouth daily., Disp: 90 tablet, Rfl: 1 .  levonorgestrel (MIRENA) 20 MCG/24HR IUD, 1 Intra Uterine Device (1 each total) by Intrauterine route once for 1 dose., Disp: 1 each, Rfl: 2  Review of Systems:  Constitutional: Denies fever, chills, diaphoresis, appetite change and fatigue.  HEENT: Denies photophobia, eye pain, redness, hearing loss, ear pain, congestion, sore throat, rhinorrhea, sneezing, mouth sores, trouble swallowing, neck pain, neck stiffness and tinnitus.   Respiratory: Denies SOB, DOE, cough, chest tightness,  and wheezing.   Cardiovascular: Denies chest pain, palpitations and leg swelling.  Gastrointestinal: Denies nausea, vomiting, abdominal pain, diarrhea, constipation, blood in stool and abdominal distention.  Genitourinary: Denies dysuria, urgency, frequency, hematuria, flank pain and difficulty urinating.  Endocrine: Denies: hot or cold intolerance, sweats, changes in hair or nails, polyuria, polydipsia. Musculoskeletal: Denies myalgias, back pain, joint swelling, arthralgias and gait problem.  Skin: Denies pallor, rash and wound.  Neurological: Denies dizziness, seizures, syncope, weakness, light-headedness, numbness and  headaches.  Hematological: Denies adenopathy. Easy bruising, personal or family bleeding history  Psychiatric/Behavioral: Denies suicidal ideation, mood changes, confusion, nervousness, sleep disturbance and agitation    Physical  Exam: Vitals:   08/06/20 1025  BP: 110/80  Pulse: 77  Temp: 98 F (36.7 C)  TempSrc: Oral  SpO2: 99%  Weight: 206 lb 9.6 oz (93.7 kg)    Body mass index is 35.46 kg/m.   Constitutional: NAD, calm, comfortable Eyes: PERRL, lids and conjunctivae normal ENMT: Mucous membranes are moist.  Respiratory: clear to auscultation bilaterally, no wheezing, no crackles. Normal respiratory effort. No accessory muscle use.  Cardiovascular: Regular rate and rhythm, no murmurs / rubs / gallops. No extremity edema.  Neurologic: Grossly intact and nonfocal Psychiatric: Normal judgment and insight. Alert and oriented x 3. Normal mood.    Impression and Plan:  Prediabetes -A1c today is 5.7.  Transaminitis  - Plan: Comprehensive metabolic panel -Presumably due to fatty liver.  Morbid obesity (HCC) -Discussed healthy lifestyle, including increased physical activity and better food choices to promote weight loss.  Hyperlipidemia, unspecified hyperlipidemia type  - Plan: Lipid panel -She was recently started on Lipitor.  Vitamin D deficiency  - Plan: VITAMIN D 25 Hydroxy (Vit-D Deficiency, Fractures)  Other specified hypothyroidism  - Plan: TSH     Lebert Lovern Philip Aspen, MD [facility]

## 2020-08-10 ENCOUNTER — Other Ambulatory Visit: Payer: Self-pay | Admitting: Internal Medicine

## 2020-08-10 DIAGNOSIS — E559 Vitamin D deficiency, unspecified: Secondary | ICD-10-CM

## 2020-08-27 ENCOUNTER — Other Ambulatory Visit (HOSPITAL_COMMUNITY): Payer: Self-pay

## 2020-08-27 MED FILL — Etonogestrel-Ethinyl Estradiol VA Ring 0.12-0.015 MG/24HR: VAGINAL | 28 days supply | Qty: 1 | Fill #0 | Status: AC

## 2020-09-28 ENCOUNTER — Other Ambulatory Visit (HOSPITAL_COMMUNITY): Payer: Self-pay

## 2020-09-28 MED FILL — Etonogestrel-Ethinyl Estradiol VA Ring 0.12-0.015 MG/24HR: VAGINAL | 28 days supply | Qty: 1 | Fill #1 | Status: AC

## 2020-10-04 ENCOUNTER — Other Ambulatory Visit: Payer: Self-pay | Admitting: Internal Medicine

## 2020-10-04 DIAGNOSIS — E785 Hyperlipidemia, unspecified: Secondary | ICD-10-CM

## 2020-10-04 DIAGNOSIS — E038 Other specified hypothyroidism: Secondary | ICD-10-CM

## 2020-11-02 ENCOUNTER — Other Ambulatory Visit (HOSPITAL_COMMUNITY): Payer: Self-pay

## 2020-11-02 MED FILL — Etonogestrel-Ethinyl Estradiol VA Ring 0.12-0.015 MG/24HR: VAGINAL | 28 days supply | Qty: 1 | Fill #2 | Status: CN

## 2021-02-11 ENCOUNTER — Other Ambulatory Visit (HOSPITAL_COMMUNITY): Payer: Self-pay

## 2021-02-11 MED FILL — Etonogestrel-Ethinyl Estradiol VA Ring 0.12-0.015 MG/24HR: VAGINAL | 28 days supply | Qty: 1 | Fill #2 | Status: AC

## 2021-02-15 ENCOUNTER — Encounter: Payer: Self-pay | Admitting: Obstetrics and Gynecology

## 2021-02-15 ENCOUNTER — Ambulatory Visit (INDEPENDENT_AMBULATORY_CARE_PROVIDER_SITE_OTHER): Payer: Commercial Managed Care - PPO | Admitting: Obstetrics and Gynecology

## 2021-02-15 ENCOUNTER — Other Ambulatory Visit: Payer: Self-pay | Admitting: Internal Medicine

## 2021-02-15 ENCOUNTER — Ambulatory Visit (INDEPENDENT_AMBULATORY_CARE_PROVIDER_SITE_OTHER): Payer: Commercial Managed Care - PPO | Admitting: Internal Medicine

## 2021-02-15 ENCOUNTER — Other Ambulatory Visit: Payer: Self-pay

## 2021-02-15 ENCOUNTER — Encounter: Payer: Self-pay | Admitting: Internal Medicine

## 2021-02-15 VITALS — BP 110/70 | HR 86 | Temp 98.5°F | Ht 64.0 in | Wt 209.0 lb

## 2021-02-15 VITALS — BP 110/64 | HR 73 | Ht 64.0 in | Wt 210.0 lb

## 2021-02-15 DIAGNOSIS — E559 Vitamin D deficiency, unspecified: Secondary | ICD-10-CM

## 2021-02-15 DIAGNOSIS — Z975 Presence of (intrauterine) contraceptive device: Secondary | ICD-10-CM | POA: Diagnosis not present

## 2021-02-15 DIAGNOSIS — J309 Allergic rhinitis, unspecified: Secondary | ICD-10-CM | POA: Diagnosis not present

## 2021-02-15 DIAGNOSIS — E038 Other specified hypothyroidism: Secondary | ICD-10-CM | POA: Diagnosis not present

## 2021-02-15 DIAGNOSIS — Z8742 Personal history of other diseases of the female genital tract: Secondary | ICD-10-CM

## 2021-02-15 DIAGNOSIS — N921 Excessive and frequent menstruation with irregular cycle: Secondary | ICD-10-CM | POA: Diagnosis not present

## 2021-02-15 DIAGNOSIS — R7302 Impaired glucose tolerance (oral): Secondary | ICD-10-CM | POA: Diagnosis not present

## 2021-02-15 DIAGNOSIS — E785 Hyperlipidemia, unspecified: Secondary | ICD-10-CM

## 2021-02-15 DIAGNOSIS — Z0000 Encounter for general adult medical examination without abnormal findings: Secondary | ICD-10-CM | POA: Diagnosis not present

## 2021-02-15 DIAGNOSIS — R7401 Elevation of levels of liver transaminase levels: Secondary | ICD-10-CM | POA: Diagnosis not present

## 2021-02-15 LAB — COMPREHENSIVE METABOLIC PANEL
ALT: 22 U/L (ref 0–35)
AST: 15 U/L (ref 0–37)
Albumin: 4.3 g/dL (ref 3.5–5.2)
Alkaline Phosphatase: 80 U/L (ref 39–117)
BUN: 13 mg/dL (ref 6–23)
CO2: 25 mEq/L (ref 19–32)
Calcium: 9.4 mg/dL (ref 8.4–10.5)
Chloride: 105 mEq/L (ref 96–112)
Creatinine, Ser: 0.66 mg/dL (ref 0.40–1.20)
GFR: 116.01 mL/min (ref >60.00)
Glucose, Bld: 93 mg/dL (ref 70–99)
Potassium: 3.9 mEq/L (ref 3.5–5.1)
Sodium: 138 mEq/L (ref 135–145)
Total Bilirubin: 0.5 mg/dL (ref 0.2–1.2)
Total Protein: 7.5 g/dL (ref 6.0–8.3)

## 2021-02-15 LAB — LIPID PANEL
Cholesterol: 156 mg/dL (ref 0–200)
HDL: 59.8 mg/dL (ref >39.00)
LDL Cholesterol: 77 mg/dL (ref 0–99)
NonHDL: 95.95
Total CHOL/HDL Ratio: 3
Triglycerides: 95 mg/dL (ref 0.0–149.0)
VLDL: 19 mg/dL (ref 0.0–40.0)

## 2021-02-15 LAB — TSH: TSH: 4.5 u[IU]/mL (ref 0.35–5.50)

## 2021-02-15 LAB — VITAMIN D 25 HYDROXY (VIT D DEFICIENCY, FRACTURES): VITD: 16.55 ng/mL — ABNORMAL LOW (ref 30.00–100.00)

## 2021-02-15 LAB — HEMOGLOBIN A1C: Hgb A1c MFr Bld: 5.6 % (ref 4.6–6.5)

## 2021-02-15 MED ORDER — VITAMIN D (ERGOCALCIFEROL) 1.25 MG (50000 UNIT) PO CAPS
50000.0000 [IU] | ORAL_CAPSULE | ORAL | 0 refills | Status: DC
Start: 2021-02-15 — End: 2021-02-15

## 2021-02-15 MED ORDER — LEVOTHYROXINE SODIUM 75 MCG PO TABS: 75.0000 ug | ORAL_TABLET | Freq: Every day | ORAL | 1 refills | Status: DC

## 2021-02-15 MED ORDER — ATORVASTATIN CALCIUM 10 MG PO TABS: 10.0000 mg | ORAL_TABLET | Freq: Every day | ORAL | 1 refills | Status: DC

## 2021-02-15 MED ORDER — AZELASTINE HCL 0.1 % NA SOLN: 2.0000 | Freq: Two times a day (BID) | NASAL | 3 refills | Status: AC

## 2021-02-15 MED ORDER — ETONOGESTREL-ETHINYL ESTRADIOL 0.12-0.015 MG/24HR VA RING
VAGINAL_RING | VAGINAL | 0 refills | Status: DC
  Filled 2021-03-08: qty 1, 28d supply, fill #0

## 2021-02-15 NOTE — Patient Instructions (Signed)
Greet  -Lab work today; will notify you once results are available.  -Schedule follow up in 6 months.

## 2021-02-15 NOTE — Progress Notes (Signed)
Established Patient Office Visit     This visit occurred during the SARS-CoV-2 public health emergency.  Safety protocols were in place, including screening questions prior to the visit, additional usage of staff PPE, and extensive cleaning of exam room while observing appropriate contact time as indicated for disinfecting solutions.    CC/Reason for Visit: Annual preventive exam  HPI: Cheryl Carr is a 30 y.o. female who is coming in today for the above mentioned reasons. Past Medical History is significant for: Hypothyroidism, exercise-induced asthma, impaired glucose tolerance, transaminitis, hyperlipidemia, prior vitamin D deficiency.  She is feeling well and has no acute concerns today.  She has routine eye and dental care.  She had her flu and COVID booster recently.  She has follow-up with GYN today.   Past Medical/Surgical History: Past Medical History:  Diagnosis Date   Abnormal uterine bleeding    Prediabetes    Thyroid disease    Hypo    No past surgical history on file.  Social History:  reports that she has never smoked. She has never used smokeless tobacco. She reports that she does not drink alcohol and does not use drugs.  Allergies: No Known Allergies  Family History:  Family History  Problem Relation Age of Onset   Breast cancer Maternal Aunt    Cancer Maternal Grandfather    Heart disease Paternal Grandfather      Current Outpatient Medications:    albuterol (VENTOLIN HFA) 108 (90 Base) MCG/ACT inhaler, INHALE 2 PUFFS BY MOUTH INTO THE LUNGS PRIOR TO EXERCISE, Disp: 8.5 g, Rfl: 3   cetirizine (ZYRTEC) 10 MG tablet, , Disp: , Rfl:    etonogestrel-ethinyl estradiol (NUVARING) 0.12-0.015 MG/24HR vaginal ring, INSERT VAGINALLY AND LEAVE IN PLACE FOR 3 CONSECUTIVE WEEKS, THEN REMOVE FOR 1 WEEK. (Patient taking differently: INSERT VAGINALLY AND LEAVE IN PLACE FOR 3 CONSECUTIVE WEEKS, THEN REMOVE FOR 1 WEEK.), Disp: 1 each, Rfl: 12   levonorgestrel (MIRENA) 20  MCG/24HR IUD, 1 Intra Uterine Device (1 each total) by Intrauterine route once for 1 dose., Disp: 1 each, Rfl: 2   atorvastatin (LIPITOR) 10 MG tablet, Take 1 tablet (10 mg total) by mouth daily., Disp: 90 tablet, Rfl: 1   azelastine (ASTELIN) 0.1 % nasal spray, Place 2 sprays into both nostrils 2 (two) times daily. Use in each nostril as directed, Disp: 30 mL, Rfl: 3   levothyroxine (SYNTHROID) 75 MCG tablet, Take 1 tablet (75 mcg total) by mouth daily., Disp: 90 tablet, Rfl: 1  Review of Systems:  Constitutional: Denies fever, chills, diaphoresis, appetite change and fatigue.  HEENT: Denies photophobia, eye pain, redness, hearing loss, ear pain, congestion, sore throat, rhinorrhea, sneezing, mouth sores, trouble swallowing, neck pain, neck stiffness and tinnitus.   Respiratory: Denies SOB, DOE, cough, chest tightness,  and wheezing.   Cardiovascular: Denies chest pain, palpitations and leg swelling.  Gastrointestinal: Denies nausea, vomiting, abdominal pain, diarrhea, constipation, blood in stool and abdominal distention.  Genitourinary: Denies dysuria, urgency, frequency, hematuria, flank pain and difficulty urinating.  Endocrine: Denies: hot or cold intolerance, sweats, changes in hair or nails, polyuria, polydipsia. Musculoskeletal: Denies myalgias, back pain, joint swelling, arthralgias and gait problem.  Skin: Denies pallor, rash and wound.  Neurological: Denies dizziness, seizures, syncope, weakness, light-headedness, numbness and headaches.  Hematological: Denies adenopathy. Easy bruising, personal or family bleeding history  Psychiatric/Behavioral: Denies suicidal ideation, mood changes, confusion, nervousness, sleep disturbance and agitation    Physical Exam: Vitals:   02/15/21 0855  BP: 110/70  Pulse: 86  Temp: 98.5 F (36.9 C)  TempSrc: Oral  SpO2: 98%  Weight: 209 lb (94.8 kg)  Height: 5\' 4"  (1.626 m)    Body mass index is 35.87 kg/m.   Constitutional: NAD, calm,  comfortable Eyes: PERRL, lids and conjunctivae normal ENMT: Mucous membranes are moist. Posterior pharynx clear of any exudate or lesions. Normal dentition. Tympanic membrane is pearly white, no erythema or bulging. Neck: normal, supple, no masses, no thyromegaly Respiratory: clear to auscultation bilaterally, no wheezing, no crackles. Normal respiratory effort. No accessory muscle use.  Cardiovascular: Regular rate and rhythm, no murmurs / rubs / gallops. No extremity edema. 2+ pedal pulses. No carotid bruits.  Abdomen: no tenderness, no masses palpated. No hepatosplenomegaly. Bowel sounds positive.  Musculoskeletal: no clubbing / cyanosis. No joint deformity upper and lower extremities. Good ROM, no contractures. Normal muscle tone.  Skin: no rashes, lesions, ulcers. No induration Neurologic: CN 2-12 grossly intact. Sensation intact, DTR normal. Strength 5/5 in all 4.  Psychiatric: Normal judgment and insight. Alert and oriented x 3. Normal mood.    Impression and Plan:  Encounter for preventive health examination -Recommend routine eye and dental care. -Immunizations: All immunizations are up-to-date and age-appropriate -Healthy lifestyle discussed in detail. -Labs to be updated today. -Colon cancer screening: Not applicable -Breast cancer screening: Not applicable -Cervical cancer screening: Last in 2019, has follow-up with GYN today. -Lung cancer screening: Not applicable -Prostate cancer screening: Not applicable -DEXA: Not applicable  Hyperlipidemia, unspecified hyperlipidemia type  - Plan: atorvastatin (LIPITOR) 10 MG tablet, Lipid panel  Allergic rhinitis, unspecified seasonality, unspecified trigger  - Plan: azelastine (ASTELIN) 0.1 % nasal spray  Other specified hypothyroidism  - Plan: levothyroxine (SYNTHROID) 75 MCG tablet, TSH  Transaminitis  - Plan: Comprehensive metabolic panel  IGT (impaired glucose tolerance)  - Plan: Hemoglobin A1c  Morbid obesity  ([place]) -Discussed healthy lifestyle, including increased physical activity and better food choices to promote weight loss.  Vitamin D deficiency  - Plan: VITAMIN D 25 Hydroxy (Vit-D Deficiency, Fractures)    Patient Instructions  Greet  -Lab work today; will notify you once results are available.  -Schedule follow up in 6 months.   Lelon Frohlich, MD [facility]

## 2021-02-15 NOTE — Progress Notes (Signed)
GYNECOLOGY  VISIT   HPI: 30 y.o.   Single Asian Not Hispanic or Latino  female   G0P0000 with Patient's last menstrual period was 01/26/2021.   here for irregular bleeding with IUD.  Patient is not sexually active.   Mirena IUD was placed in 7/19. Placed with ultrasound guidance.  Since the IUD was placed, she has always had bleeding for 3 days a month, then spotting for up to 2 weeks. In the last year she had 7-10 days of heavier flow (changing a tampon 3 x a day). Most months she is spotting 1/2 the month. Since 12/21 she has had more days with the heavier bleeding, + the spotting.  At times she can go 3-4 weeks without bleeding, then bleeds for 2-3 weeks. Only cramping since she inserted the nuvaring 4 days ago.  Since last December she has used 7 months of the nuvaring to try and control the bleeding. She currently has a nuvaring in, previously used it in June. She can't feel her IUD strings.   Not sexually active since prior std testing several years ago.   Labs reviewed. TSH from today was 4.5, CMP normal, HgbA1C 5.6% (improved), normal lipid panel. No CBC was drawn.  Prior to the IUD she had very heavy cycles. Also hypothyroid at the time. In between the last IUD and this one she had heavy bleeding.   GYNECOLOGIC HISTORY: Patient's last menstrual period was 01/26/2021.01/26/21 and still bleeding.  Contraception:IUD inserted 10/25/17   Menopausal hormone therapy: none         OB History     Gravida  0   Para  0   Term  0   Preterm  0   AB  0   Living  0      SAB  0   IAB  0   Ectopic  0   Multiple  0   Live Births  0              Patient Active Problem List   Diagnosis Date Noted   Transaminitis 02/25/2020   Prediabetes    Morbid obesity (HCC) 12/18/2018   Family history of premature CAD 12/18/2018   Hyperlipidemia 12/18/2018   Vitamin D deficiency 12/18/2018   IGT (impaired glucose tolerance) 12/18/2018   Exercise-induced asthma 11/29/2018    Allergic rhinitis 11/29/2018   Hypothyroidism 09/11/2018    Past Medical History:  Diagnosis Date   Abnormal uterine bleeding    Prediabetes    Thyroid disease    Hypo    History reviewed. No pertinent surgical history.  Current Outpatient Medications  Medication Sig Dispense Refill   albuterol (VENTOLIN HFA) 108 (90 Base) MCG/ACT inhaler INHALE 2 PUFFS BY MOUTH INTO THE LUNGS PRIOR TO EXERCISE 8.5 g 3   atorvastatin (LIPITOR) 10 MG tablet Take 1 tablet (10 mg total) by mouth daily. 90 tablet 1   azelastine (ASTELIN) 0.1 % nasal spray Place 2 sprays into both nostrils 2 (two) times daily. Use in each nostril as directed 30 mL 3   cetirizine (ZYRTEC) 10 MG tablet      etonogestrel-ethinyl estradiol (NUVARING) 0.12-0.015 MG/24HR vaginal ring INSERT VAGINALLY AND LEAVE IN PLACE FOR 3 CONSECUTIVE WEEKS, THEN REMOVE FOR 1 WEEK. (Patient taking differently: INSERT VAGINALLY AND LEAVE IN PLACE FOR 3 CONSECUTIVE WEEKS, THEN REMOVE FOR 1 WEEK.) 1 each 12   levonorgestrel (MIRENA) 20 MCG/24HR IUD 1 Intra Uterine Device (1 each total) by Intrauterine route once for 1 dose. 1 each  2   levothyroxine (SYNTHROID) 75 MCG tablet Take 1 tablet (75 mcg total) by mouth daily. 90 tablet 1   No current facility-administered medications for this visit.     ALLERGIES: Patient has no known allergies.  Family History  Problem Relation Age of Onset   Breast cancer Maternal Aunt    Cancer Maternal Grandfather    Heart disease Paternal Grandfather     Social History   Socioeconomic History   Marital status: Single    Spouse name: Not on file   Number of children: Not on file   Years of education: Not on file   Highest education level: Not on file  Occupational History   Not on file  Tobacco Use   Smoking status: Never   Smokeless tobacco: Never  Vaping Use   Vaping Use: Never used  Substance and Sexual Activity   Alcohol use: No   Drug use: No   Sexual activity: Not Currently    Birth  control/protection: I.U.D.    Comment: Mirena  Other Topics Concern   Not on file  Social History Narrative   Not on file   Social Determinants of Health   Financial Resource Strain: Not on file  Food Insecurity: Not on file  Transportation Needs: Not on file  Physical Activity: Not on file  Stress: Not on file  Social Connections: Not on file  Intimate Partner Violence: Not on file    Review of Systems  All other systems reviewed and are negative.  PHYSICAL EXAMINATION:    BP 110/64   Pulse 73   Ht 5\' 4"  (1.626 m)   Wt 210 lb (95.3 kg)   LMP 01/26/2021   SpO2 98%   BMI 36.05 kg/m     General appearance: alert, cooperative and appears stated age Neck: no adenopathy, supple, symmetrical, trachea midline and thyroid normal to inspection and palpation  Pelvic: External genitalia:  no lesions              Urethra:  normal appearing urethra with no masses, tenderness or lesions              Bartholins and Skenes: normal                 Vagina: normal appearing vagina with normal color and discharge, no lesions              Cervix: no cervical motion tenderness, no lesions, and IUD strings 3-4 cm              Bimanual Exam:  Uterus:  normal size, contour, position, consistency, mobility, non-tender              Adnexa: no mass, fullness, tenderness                Chaperone was present for exam.  1. Breakthrough bleeding with IUD Worsening this year.  - 03/28/2021 PELVIS TRANSVAGINAL NON-OB (TV ONLY); Future - etonogestrel-ethinyl estradiol (NUVARING) 0.12-0.015 MG/24HR vaginal ring; INSERT VAGINALLY AND LEAVE IN PLACE FOR 3 CONSECUTIVE WEEKS, THEN REMOVE FOR 1 WEEK.  Dispense: 3 each; Refill: 0 -Discussed removing the IUD, she will consider -I wouldn't recommend that she continue the IUD and nuvaring long term.   2. History of menorrhagia Discussed options of stopping contraception and using NSAID's for heavy cycles, or possibly lysteda.  -She will consider.

## 2021-02-18 ENCOUNTER — Other Ambulatory Visit: Payer: Self-pay | Admitting: Internal Medicine

## 2021-02-18 DIAGNOSIS — E559 Vitamin D deficiency, unspecified: Secondary | ICD-10-CM

## 2021-03-07 ENCOUNTER — Other Ambulatory Visit (HOSPITAL_COMMUNITY): Payer: Self-pay

## 2021-03-08 ENCOUNTER — Other Ambulatory Visit (HOSPITAL_COMMUNITY): Payer: Self-pay

## 2021-04-19 ENCOUNTER — Ambulatory Visit (INDEPENDENT_AMBULATORY_CARE_PROVIDER_SITE_OTHER): Payer: Commercial Managed Care - PPO | Admitting: Obstetrics and Gynecology

## 2021-04-19 ENCOUNTER — Ambulatory Visit (INDEPENDENT_AMBULATORY_CARE_PROVIDER_SITE_OTHER): Payer: Commercial Managed Care - PPO

## 2021-04-19 ENCOUNTER — Other Ambulatory Visit: Payer: Self-pay

## 2021-04-19 ENCOUNTER — Encounter: Payer: Self-pay | Admitting: Obstetrics and Gynecology

## 2021-04-19 DIAGNOSIS — Z975 Presence of (intrauterine) contraceptive device: Secondary | ICD-10-CM

## 2021-04-19 DIAGNOSIS — Z3009 Encounter for other general counseling and advice on contraception: Secondary | ICD-10-CM

## 2021-04-19 DIAGNOSIS — T8332XA Displacement of intrauterine contraceptive device, initial encounter: Secondary | ICD-10-CM | POA: Diagnosis not present

## 2021-04-19 DIAGNOSIS — N921 Excessive and frequent menstruation with irregular cycle: Secondary | ICD-10-CM

## 2021-04-19 MED ORDER — ETONOGESTREL-ETHINYL ESTRADIOL 0.12-0.015 MG/24HR VA RING
VAGINAL_RING | VAGINAL | 0 refills | Status: DC
  Filled 2021-04-19: qty 1, 28d supply, fill #0
  Filled 2021-05-12: qty 1, 28d supply, fill #1
  Filled 2021-06-07: qty 1, 28d supply, fill #2

## 2021-04-19 NOTE — Progress Notes (Signed)
GYNECOLOGY  VISIT   HPI: 31 y.o.   Single Asian Not Hispanic or Latino  female   G0P0000 with No LMP recorded.   here for evaluation of irregular bleeding with a mirena IUD. The IUD was placed in 7/19 in ultrasound guidance (because she had previously expulsed an IUD). She was started on the nuvaring in 11/22 to try and control the bleeding, it helped, she isn't using it currently. Prior to the IUD she had menorrhagia.   She is not sexually active.   GYNECOLOGIC HISTORY: No LMP recorded. Contraception:IUD, abstinence  Menopausal hormone therapy: NA        OB History     Gravida  0   Para  0   Term  0   Preterm  0   AB  0   Living  0      SAB  0   IAB  0   Ectopic  0   Multiple  0   Live Births  0              Patient Active Problem List   Diagnosis Date Noted   Transaminitis 02/25/2020   Prediabetes    Morbid obesity (HCC) 12/18/2018   Family history of premature CAD 12/18/2018   Hyperlipidemia 12/18/2018   Vitamin D deficiency 12/18/2018   IGT (impaired glucose tolerance) 12/18/2018   Exercise-induced asthma 11/29/2018   Allergic rhinitis 11/29/2018   Hypothyroidism 09/11/2018    Past Medical History:  Diagnosis Date   Abnormal uterine bleeding    Prediabetes    Thyroid disease    Hypo    No past surgical history on file.  Current Outpatient Medications  Medication Sig Dispense Refill   albuterol (VENTOLIN HFA) 108 (90 Base) MCG/ACT inhaler INHALE 2 PUFFS BY MOUTH INTO THE LUNGS PRIOR TO EXERCISE 8.5 g 3   atorvastatin (LIPITOR) 10 MG tablet Take 1 tablet (10 mg total) by mouth daily. 90 tablet 1   azelastine (ASTELIN) 0.1 % nasal spray Place 2 sprays into both nostrils 2 (two) times daily. Use in each nostril as directed 30 mL 3   cetirizine (ZYRTEC) 10 MG tablet      etonogestrel-ethinyl estradiol (NUVARING) 0.12-0.015 MG/24HR vaginal ring INSERT 1 RING VAGINALLY AND LEAVE IN PLACE FOR 3 CONSECUTIVE WEEKS, THEN REMOVE FOR 1 WEEK. 3 each 0    levonorgestrel (MIRENA) 20 MCG/24HR IUD 1 Intra Uterine Device (1 each total) by Intrauterine route once for 1 dose. 1 each 2   levothyroxine (SYNTHROID) 75 MCG tablet Take 1 tablet (75 mcg total) by mouth daily. 90 tablet 1   No current facility-administered medications for this visit.     ALLERGIES: Patient has no known allergies.  Family History  Problem Relation Age of Onset   Breast cancer Maternal Aunt    Cancer Maternal Grandfather    Heart disease Paternal Grandfather     Social History   Socioeconomic History   Marital status: Single    Spouse name: Not on file   Number of children: Not on file   Years of education: Not on file   Highest education level: Not on file  Occupational History   Not on file  Tobacco Use   Smoking status: Never   Smokeless tobacco: Never  Vaping Use   Vaping Use: Never used  Substance and Sexual Activity   Alcohol use: No   Drug use: No   Sexual activity: Not Currently    Birth control/protection: I.U.D.    Comment:  Mirena  Other Topics Concern   Not on file  Social History Narrative   Not on file   Social Determinants of Health   Financial Resource Strain: Not on file  Food Insecurity: Not on file  Transportation Needs: Not on file  Physical Activity: Not on file  Stress: Not on file  Social Connections: Not on file  Intimate Partner Violence: Not on file    ROS  Pelvic ultrasound  Indications: Abnormal bleeding with the IUD  Findings:  Uterus 6.62 x 4.73 x 3.66, anteverted  Endometrium 2.45, thin, symmetric IUD is displaced into the lower uterine segment  Left ovary 2.7 x 1.15 x 1.57  Right ovary 2.76 x 1.47 x 1.44  No free fluid  Impression:  Normal sized anteverted uterus Thin, symmetrical endometrium IUD displaced to the lower uterine segment Normal ovaries bilaterally   PHYSICAL EXAMINATION:    There were no vitals taken for this visit.    General appearance: alert, cooperative and appears  stated age   Pelvic: External genitalia:  no lesions              Urethra:  normal appearing urethra with no masses, tenderness or lesions              Bartholins and Skenes: normal                 Vagina: normal appearing vagina with normal color and discharge, no lesions              Cervix: no lesions and IUD string 4 cm. IUD removed with ringed forceps                Chaperone was present for exam.  1. Breakthrough bleeding with IUD IUD has moved to the LUS  2. Malpositioned intrauterine device (IUD), initial encounter IUD removed  3. General counseling and advice on female contraception Will try the nuvaring - etonogestrel-ethinyl estradiol (NUVARING) 0.12-0.015 MG/24HR vaginal ring; INSERT 1 RING VAGINALLY AND LEAVE IN PLACE FOR 3 CONSECUTIVE WEEKS, THEN REMOVE FOR 1 WEEK.  Dispense: 3 each; Refill: 0 -F/U for an annual exam in 3 months

## 2021-04-20 ENCOUNTER — Other Ambulatory Visit (HOSPITAL_COMMUNITY): Payer: Self-pay

## 2021-05-12 ENCOUNTER — Other Ambulatory Visit (HOSPITAL_COMMUNITY): Payer: Self-pay

## 2021-06-07 ENCOUNTER — Other Ambulatory Visit (HOSPITAL_COMMUNITY): Payer: Self-pay

## 2021-06-09 ENCOUNTER — Ambulatory Visit: Payer: Commercial Managed Care - PPO | Admitting: Obstetrics and Gynecology

## 2021-06-09 ENCOUNTER — Other Ambulatory Visit (HOSPITAL_COMMUNITY): Payer: Self-pay

## 2021-06-17 ENCOUNTER — Other Ambulatory Visit (HOSPITAL_COMMUNITY): Payer: Self-pay

## 2021-06-17 ENCOUNTER — Other Ambulatory Visit: Payer: Self-pay | Admitting: Obstetrics and Gynecology

## 2021-06-17 DIAGNOSIS — Z3009 Encounter for other general counseling and advice on contraception: Secondary | ICD-10-CM

## 2021-06-17 MED ORDER — ETONOGESTREL-ETHINYL ESTRADIOL 0.12-0.015 MG/24HR VA RING
1.0000 | VAGINAL_RING | VAGINAL | 0 refills | Status: DC
  Filled 2021-06-17: qty 3, fill #0
  Filled 2021-07-06: qty 1, 28d supply, fill #0
  Filled 2021-08-10: qty 1, 28d supply, fill #1

## 2021-06-17 NOTE — Telephone Encounter (Signed)
Last annual exam was 05/2020 ?Scheduled on 08/18/21 ?

## 2021-07-06 ENCOUNTER — Other Ambulatory Visit (HOSPITAL_COMMUNITY): Payer: Self-pay

## 2021-07-21 ENCOUNTER — Ambulatory Visit: Payer: Commercial Managed Care - PPO | Admitting: Obstetrics and Gynecology

## 2021-08-10 ENCOUNTER — Other Ambulatory Visit (HOSPITAL_COMMUNITY): Payer: Self-pay

## 2021-08-15 ENCOUNTER — Ambulatory Visit (INDEPENDENT_AMBULATORY_CARE_PROVIDER_SITE_OTHER): Payer: Commercial Managed Care - PPO | Admitting: Internal Medicine

## 2021-08-15 ENCOUNTER — Encounter: Payer: Self-pay | Admitting: Internal Medicine

## 2021-08-15 VITALS — BP 120/84 | HR 70 | Temp 97.9°F | Wt 217.2 lb

## 2021-08-15 DIAGNOSIS — E785 Hyperlipidemia, unspecified: Secondary | ICD-10-CM

## 2021-08-15 DIAGNOSIS — R7401 Elevation of levels of liver transaminase levels: Secondary | ICD-10-CM | POA: Diagnosis not present

## 2021-08-15 DIAGNOSIS — E559 Vitamin D deficiency, unspecified: Secondary | ICD-10-CM

## 2021-08-15 DIAGNOSIS — E038 Other specified hypothyroidism: Secondary | ICD-10-CM | POA: Diagnosis not present

## 2021-08-15 DIAGNOSIS — R7302 Impaired glucose tolerance (oral): Secondary | ICD-10-CM

## 2021-08-15 LAB — POCT GLYCOSYLATED HEMOGLOBIN (HGB A1C): Hemoglobin A1C: 5.7 % — AB (ref 4.0–5.6)

## 2021-08-15 LAB — TSH: TSH: 2.38 u[IU]/mL (ref 0.35–5.50)

## 2021-08-15 MED ORDER — VITAMIN D (ERGOCALCIFEROL) 1.25 MG (50000 UNIT) PO CAPS
50000.0000 [IU] | ORAL_CAPSULE | ORAL | 0 refills | Status: AC
Start: 2021-08-15 — End: 2021-11-01

## 2021-08-15 NOTE — Progress Notes (Signed)
? ? ? ?Established Patient Office Visit ? ? ? ? ?This visit occurred during the SARS-CoV-2 public health emergency.  Safety protocols were in place, including screening questions prior to the visit, additional usage of staff PPE, and extensive cleaning of exam room while observing appropriate contact time as indicated for disinfecting solutions.  ? ? ?CC/Reason for Visit: 37-month follow-up chronic medical conditions ? ?HPI: Jalea Sheldon is a 31 y.o. female who is coming in today for the above mentioned reasons. Past Medical History is significant for: Hypothyroidism, impaired glucose tolerance, transaminitis, hyperlipidemia, vitamin D deficiency, asthma.  She has done well on lifestyle changes, unfortunately over the last 6 months she noticed a trend of weight gain despite leading a healthy lifestyle.  She is concerned that the NuvaRing might be contributing.  She has an appointment with GYN to discuss later this week. ? ? ?Past Medical/Surgical History: ?Past Medical History:  ?Diagnosis Date  ? Abnormal uterine bleeding   ? Prediabetes   ? Thyroid disease   ? Hypo  ? ? ?No past surgical history on file. ? ?Social History: ? reports that she has never smoked. She has never used smokeless tobacco. She reports that she does not drink alcohol and does not use drugs. ? ?Allergies: ?No Known Allergies ? ?Family History:  ?Family History  ?Problem Relation Age of Onset  ? Breast cancer Maternal Aunt   ? Cancer Maternal Grandfather   ? Heart disease Paternal Grandfather   ? ? ? ?Current Outpatient Medications:  ?  albuterol (VENTOLIN HFA) 108 (90 Base) MCG/ACT inhaler, INHALE 2 PUFFS BY MOUTH INTO THE LUNGS PRIOR TO EXERCISE, Disp: 8.5 g, Rfl: 3 ?  atorvastatin (LIPITOR) 10 MG tablet, Take 1 tablet (10 mg total) by mouth daily., Disp: 90 tablet, Rfl: 1 ?  azelastine (ASTELIN) 0.1 % nasal spray, Place 2 sprays into both nostrils 2 (two) times daily. Use in each nostril as directed, Disp: 30 mL, Rfl: 3 ?  cetirizine (ZYRTEC) 10  MG tablet, , Disp: , Rfl:  ?  etonogestrel-ethinyl estradiol (NUVARING) 0.12-0.015 MG/24HR vaginal ring, INSERT 1 RING VAGINALLY AND LEAVE IN PLACE FOR 3 CONSECUTIVE WEEKS, THEN REMOVE FOR 1 WEEK., Disp: 3 each, Rfl: 0 ?  levothyroxine (SYNTHROID) 75 MCG tablet, Take 1 tablet (75 mcg total) by mouth daily., Disp: 90 tablet, Rfl: 1 ?  Vitamin D, Ergocalciferol, (DRISDOL) 1.25 MG (50000 UNIT) CAPS capsule, Take 1 capsule (50,000 Units total) by mouth every 7 (seven) days for 12 doses., Disp: 12 capsule, Rfl: 0 ? ?Review of Systems:  ?Constitutional: Denies fever, chills, diaphoresis, appetite change and fatigue.  ?HEENT: Denies photophobia, eye pain, redness, hearing loss, ear pain, congestion, sore throat, rhinorrhea, sneezing, mouth sores, trouble swallowing, neck pain, neck stiffness and tinnitus.   ?Respiratory: Denies SOB, DOE, cough, chest tightness,  and wheezing.   ?Cardiovascular: Denies chest pain, palpitations and leg swelling.  ?Gastrointestinal: Denies nausea, vomiting, abdominal pain, diarrhea, constipation, blood in stool and abdominal distention.  ?Genitourinary: Denies dysuria, urgency, frequency, hematuria, flank pain and difficulty urinating.  ?Endocrine: Denies: hot or cold intolerance, sweats, changes in hair or nails, polyuria, polydipsia. ?Musculoskeletal: Denies myalgias, back pain, joint swelling, arthralgias and gait problem.  ?Skin: Denies pallor, rash and wound.  ?Neurological: Denies dizziness, seizures, syncope, weakness, light-headedness, numbness and headaches.  ?Hematological: Denies adenopathy. Easy bruising, personal or family bleeding history  ?Psychiatric/Behavioral: Denies suicidal ideation, mood changes, confusion, nervousness, sleep disturbance and agitation ? ? ? ?Physical Exam: ?Vitals:  ? 08/15/21 0924  ?  BP: 120/84  ?Pulse: 70  ?Temp: 97.9 ?F (36.6 ?C)  ?TempSrc: Oral  ?SpO2: 99%  ?Weight: 217 lb 3.2 oz (98.5 kg)  ? ? ?Body mass index is 37.28 kg/m?. ? ? ?Constitutional: NAD,  calm, comfortable ?Eyes: PERRL, lids and conjunctivae normal ?ENMT: Mucous membranes are moist.  ?Respiratory: clear to auscultation bilaterally, no wheezing, no crackles. Normal respiratory effort. No accessory muscle use.  ?Cardiovascular: Regular rate and rhythm, no murmurs / rubs / gallops. No extremity edema.   ?Psychiatric: Normal judgment and insight. Alert and oriented x 3. Normal mood.  ? ? ?Impression and Plan: ? ?IGT (impaired glucose tolerance) ? - Plan: POC HgB A1c ?-A1c is stable at 5.7. ? ?Transaminitis ?-LFTs were within range at last check. ? ?Morbid obesity (HCC) ?-Discussed healthy lifestyle, including increased physical activity and better food choices to promote weight loss. ?-She will discuss with GYN whether NuvaRing could be contributing. ?-Check TSH today. ? ?Hyperlipidemia, unspecified hyperlipidemia type ?-Last LDL was 77 in November.  She is on atorvastatin 10 mg. ? ?Vitamin D deficiency  ?- Plan: Vitamin D, Ergocalciferol, (DRISDOL) 1.25 MG (50000 UNIT) CAPS capsule ? ?Other specified hypothyroidism  ?- Plan: TSH ? ? ? ?Time spent:31 minutes reviewing chart, interviewing and examining patient and formulating plan of care. ? ? ? ? ?[name], MD ?[facility] ? ? ?

## 2021-08-16 NOTE — Progress Notes (Signed)
31 y.o. G0P0000 Single Asian Not Hispanic or Latino female here for annual exam.  She wants to know if her weight gain has to do with the Nuvaring. She has gained about 20lbs in about 8 months with 4 days a week of exercise and healthy eating.  ?Right before her cycle she has some moodiness, not as bad as it was on OCP's.  ?Period Cycle (Days): 28 ?Period Duration (Days): 3 ?Period Pattern: Regular ?Menstrual Flow: Light ?Menstrual Control: Tampon ?Menstrual Control Change Freq (Hours): 6 ?Dysmenorrhea: None ? ?Recent TSH was 2.38 on 08/15/21 ? ?HgbA1C was 5.7 on 08/15/21 ? ?She is going to [place] for 3 weeks in August.  ? ?Patient's last menstrual period was 08/10/2021 (exact date).          ?Sexually active: No.  ?The current method of family planning is NuvaRing vaginal inserts.    ?Exercising: Yes.     Weights and cardio 4x a week  ?Smoker:  no ? ?Health Maintenance: ?Pap:  05/19/20 WNL Hr HPV Neg, 11/29/2017 WNL ?History of abnormal Pap:  no ?MMG:  none ?BMD:   none  ?Colonoscopy: none  ?TDaP:  12/17/17  ?Gardasil: complete  ? ? reports that she has never smoked. She has never used smokeless tobacco. She reports that she does not drink alcohol and does not use drugs. 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  ? ?Past Medical History:  ?Diagnosis Date  ? Abnormal uterine bleeding   ? Prediabetes   ? Thyroid disease   ? Hypo  ? ? ?No past surgical history on file. ? ?Current Outpatient Medications  ?Medication Sig Dispense Refill  ? albuterol (VENTOLIN HFA) 108 (90 Base) MCG/ACT inhaler INHALE 2 PUFFS BY MOUTH INTO THE LUNGS PRIOR TO EXERCISE 8.5 g 3  ? atorvastatin (LIPITOR) 10 MG tablet Take 1 tablet (10 mg total) by mouth daily. 90 tablet 1  ? azelastine (ASTELIN) 0.1 % nasal spray Place 2 sprays into both nostrils 2 (two) times daily. Use in each nostril as directed 30 mL 3  ? cetirizine (ZYRTEC) 10 MG tablet     ? etonogestrel-ethinyl estradiol (NUVARING) 0.12-0.015 MG/24HR vaginal ring INSERT 1  RING VAGINALLY AND LEAVE IN PLACE FOR 3 CONSECUTIVE WEEKS, THEN REMOVE FOR 1 WEEK. 3 each 0  ? levothyroxine (SYNTHROID) 75 MCG tablet Take 1 tablet (75 mcg total) by mouth daily. 90 tablet 1  ? Vitamin D, Ergocalciferol, (DRISDOL) 1.25 MG (50000 UNIT) CAPS capsule Take 1 capsule (50,000 Units total) by mouth every 7 (seven) days for 12 doses. 12 capsule 0  ? ?No current facility-administered medications for this visit.  ? ? ?Family History  ?Problem Relation Age of Onset  ? Breast cancer Maternal Aunt   ? Cancer Maternal Grandfather   ? Heart disease Paternal Grandfather   ? ? ?Review of Systems  ?All other systems reviewed and are negative. ? ?Exam:   ?BP 128/78   Pulse 77   Ht 5\' 4"  (1.626 m)   Wt 222 lb (100.7 kg)   LMP 08/10/2021 (Exact Date)   SpO2 100%   BMI 38.11 kg/m?   Weight change: @WEIGHTCHANGE @ Height:   Height: 5\' 4"  (162.6 cm)  ?Ht Readings from Last 3 Encounters:  ?08/18/21 5\' 4"  (1.626 m)  ?02/15/21 5\' 4"  (1.626 m)  ?02/15/21 5\' 4"  (1.626 m)  ? ? ?General appearance: alert, cooperative and appears stated age ?Head: Normocephalic, without obvious abnormality, atraumatic ?Neck: no adenopathy, supple, symmetrical, trachea midline  and thyroid normal to inspection and palpation ?Lungs: clear to auscultation bilaterally ?Cardiovascular: regular rate and rhythm ?Breasts: normal appearance, no masses or tenderness ?Abdomen: soft, non-tender; non distended,  no masses,  no organomegaly ?Extremities: extremities normal, atraumatic, no cyanosis or edema ?Skin: Skin color, texture, turgor normal. No rashes or lesions ?Lymph nodes: Cervical, supraclavicular, and axillary nodes normal. ?No abnormal inguinal nodes palpated ?Neurologic: Grossly normal ? ? ?Pelvic: External genitalia:  no lesions ?             Urethra:  normal appearing urethra with no masses, tenderness or lesions ?             Bartholins and Skenes: normal    ?             Vagina: normal appearing vagina with normal color and discharge, no  lesions ?             Cervix: no lesions ?              ?Bimanual Exam:  Uterus:   no masses or tenderness ?             Adnexa: no mass, fullness, tenderness ?              Rectovaginal: Confirms ?              Anus:  normal sphincter tone, no lesions ? ?Carolynn Serve chaperoned for the exam. ? ? ?1. Well woman exam ?Discussed breast self exam ?Discussed calcium and vit D intake ?Labs with primary ?Pap is UTD ?She is going to go to the weight loss clinic ? ?2. Encounter for surveillance of vaginal ring hormonal contraceptive device ?Doing okay ?- etonogestrel-ethinyl estradiol (NUVARING) 0.12-0.015 MG/24HR vaginal ring; INSERT 1 RING VAGINALLY AND LEAVE IN PLACE FOR 3 CONSECUTIVE WEEKS, THEN REMOVE FOR 1 WEEK.  Dispense: 3 each; Refill: 3 ? ? ?

## 2021-08-18 ENCOUNTER — Encounter: Payer: Self-pay | Admitting: Obstetrics and Gynecology

## 2021-08-18 ENCOUNTER — Ambulatory Visit (INDEPENDENT_AMBULATORY_CARE_PROVIDER_SITE_OTHER): Payer: Commercial Managed Care - PPO | Admitting: Obstetrics and Gynecology

## 2021-08-18 ENCOUNTER — Other Ambulatory Visit (HOSPITAL_COMMUNITY): Payer: Self-pay

## 2021-08-18 VITALS — BP 128/78 | HR 77 | Ht 64.0 in | Wt 222.0 lb

## 2021-08-18 DIAGNOSIS — Z3044 Encounter for surveillance of vaginal ring hormonal contraceptive device: Secondary | ICD-10-CM

## 2021-08-18 DIAGNOSIS — Z01419 Encounter for gynecological examination (general) (routine) without abnormal findings: Secondary | ICD-10-CM

## 2021-08-18 MED ORDER — ETONOGESTREL-ETHINYL ESTRADIOL 0.12-0.015 MG/24HR VA RING
1.0000 | VAGINAL_RING | VAGINAL | 3 refills | Status: DC
  Filled 2021-08-18: qty 3, 84d supply, fill #0
  Filled 2021-09-09: qty 1, 28d supply, fill #0
  Filled 2021-10-02: qty 1, 28d supply, fill #1
  Filled 2021-10-30: qty 1, 28d supply, fill #2
  Filled 2021-12-05: qty 1, 28d supply, fill #3
  Filled 2021-12-30: qty 1, 28d supply, fill #4
  Filled 2022-01-27 – 2022-02-09 (×2): qty 1, 28d supply, fill #5
  Filled 2022-03-01: qty 1, 28d supply, fill #6

## 2021-08-18 NOTE — Patient Instructions (Signed)

## 2021-09-09 ENCOUNTER — Other Ambulatory Visit (HOSPITAL_COMMUNITY): Payer: Self-pay

## 2021-10-03 ENCOUNTER — Other Ambulatory Visit (HOSPITAL_COMMUNITY): Payer: Self-pay

## 2021-10-25 ENCOUNTER — Other Ambulatory Visit: Payer: Self-pay | Admitting: Internal Medicine

## 2021-10-25 DIAGNOSIS — E038 Other specified hypothyroidism: Secondary | ICD-10-CM

## 2021-10-25 DIAGNOSIS — E785 Hyperlipidemia, unspecified: Secondary | ICD-10-CM

## 2021-10-31 ENCOUNTER — Other Ambulatory Visit (HOSPITAL_COMMUNITY): Payer: Self-pay

## 2021-12-05 ENCOUNTER — Other Ambulatory Visit (HOSPITAL_COMMUNITY): Payer: Self-pay

## 2021-12-30 ENCOUNTER — Other Ambulatory Visit (HOSPITAL_COMMUNITY): Payer: Self-pay

## 2022-01-27 ENCOUNTER — Other Ambulatory Visit (HOSPITAL_COMMUNITY): Payer: Self-pay

## 2022-02-09 ENCOUNTER — Other Ambulatory Visit (HOSPITAL_COMMUNITY): Payer: Self-pay

## 2022-02-20 ENCOUNTER — Encounter: Payer: Self-pay | Admitting: Internal Medicine

## 2022-02-20 ENCOUNTER — Ambulatory Visit (INDEPENDENT_AMBULATORY_CARE_PROVIDER_SITE_OTHER): Payer: Commercial Managed Care - PPO | Admitting: Internal Medicine

## 2022-02-20 VITALS — BP 110/78 | HR 84 | Temp 98.6°F | Ht 64.5 in | Wt 214.0 lb

## 2022-02-20 DIAGNOSIS — E559 Vitamin D deficiency, unspecified: Secondary | ICD-10-CM

## 2022-02-20 DIAGNOSIS — R7401 Elevation of levels of liver transaminase levels: Secondary | ICD-10-CM

## 2022-02-20 DIAGNOSIS — E038 Other specified hypothyroidism: Secondary | ICD-10-CM | POA: Diagnosis not present

## 2022-02-20 DIAGNOSIS — Z0000 Encounter for general adult medical examination without abnormal findings: Secondary | ICD-10-CM

## 2022-02-20 DIAGNOSIS — E785 Hyperlipidemia, unspecified: Secondary | ICD-10-CM

## 2022-02-20 DIAGNOSIS — J45990 Exercise induced bronchospasm: Secondary | ICD-10-CM | POA: Diagnosis not present

## 2022-02-20 DIAGNOSIS — R7302 Impaired glucose tolerance (oral): Secondary | ICD-10-CM | POA: Diagnosis not present

## 2022-02-20 LAB — COMPREHENSIVE METABOLIC PANEL
ALT: 16 U/L (ref 0–35)
AST: 18 U/L (ref 0–37)
Albumin: 4.2 g/dL (ref 3.5–5.2)
Alkaline Phosphatase: 84 U/L (ref 39–117)
BUN: 11 mg/dL (ref 6–23)
CO2: 26 mEq/L (ref 19–32)
Calcium: 9.8 mg/dL (ref 8.4–10.5)
Chloride: 104 mEq/L (ref 96–112)
Creatinine, Ser: 0.7 mg/dL (ref 0.40–1.20)
GFR: 113.57 mL/min (ref >60.00)
Glucose, Bld: 88 mg/dL (ref 70–99)
Potassium: 4.4 mEq/L (ref 3.5–5.1)
Sodium: 137 mEq/L (ref 135–145)
Total Bilirubin: 0.4 mg/dL (ref 0.2–1.2)
Total Protein: 7.6 g/dL (ref 6.0–8.3)

## 2022-02-20 LAB — CBC WITH DIFFERENTIAL/PLATELET
Basophils Absolute: 0.1 10*3/uL (ref 0.0–0.1)
Basophils Relative: 1 % (ref 0.0–3.0)
Eosinophils Absolute: 0.2 10*3/uL (ref 0.0–0.7)
Eosinophils Relative: 3.6 % (ref 0.0–5.0)
HCT: 40.6 % (ref 36.0–46.0)
Hemoglobin: 13.5 g/dL (ref 12.0–15.0)
Lymphocytes Relative: 29.3 % (ref 12.0–46.0)
Lymphs Abs: 2 10*3/uL (ref 0.7–4.0)
MCHC: 33.2 g/dL (ref 30.0–36.0)
MCV: 86.3 fl (ref 78.0–100.0)
Monocytes Absolute: 0.6 10*3/uL (ref 0.1–1.0)
Monocytes Relative: 9.4 % (ref 3.0–12.0)
Neutro Abs: 3.8 10*3/uL (ref 1.4–7.7)
Neutrophils Relative %: 56.7 % (ref 43.0–77.0)
Platelets: 326 10*3/uL (ref 150.0–400.0)
RBC: 4.71 Mil/uL (ref 3.87–5.11)
RDW: 13 % (ref 11.5–15.5)
WBC: 6.7 10*3/uL (ref 4.0–10.5)

## 2022-02-20 LAB — LIPID PANEL
Cholesterol: 149 mg/dL (ref 0–200)
HDL: 65.3 mg/dL (ref >39.00)
LDL Cholesterol: 68 mg/dL (ref 0–99)
NonHDL: 84.15
Total CHOL/HDL Ratio: 2
Triglycerides: 80 mg/dL (ref 0.0–149.0)
VLDL: 16 mg/dL (ref 0.0–40.0)

## 2022-02-20 LAB — HEMOGLOBIN A1C: Hgb A1c MFr Bld: 6 % (ref 4.6–6.5)

## 2022-02-20 LAB — TSH: TSH: 2.71 u[IU]/mL (ref 0.35–5.50)

## 2022-02-20 LAB — VITAMIN B12: Vitamin B-12: 341 pg/mL (ref 211–911)

## 2022-02-20 MED ORDER — ALBUTEROL SULFATE HFA 108 (90 BASE) MCG/ACT IN AERS: INHALATION_SPRAY | RESPIRATORY_TRACT | 3 refills | Status: AC

## 2022-02-20 MED ORDER — METFORMIN HCL 500 MG PO TABS: 500.0000 mg | ORAL_TABLET | Freq: Two times a day (BID) | ORAL | 3 refills | Status: AC

## 2022-02-20 NOTE — Progress Notes (Signed)
Established Patient Office Visit     CC/Reason for Visit: Annual preventive exam  HPI: Cheryl Carr is a 31 y.o. female who is coming in today for the above mentioned reasons. Past Medical History is significant for: Obesity, hypothyroidism, impaired glucose tolerance, transaminitis, hyperlipidemia, vitamin D deficiency, exercise-induced asthma.  She is feeling well today.  She started seeing a weight loss clinic in [place] and was started on metformin 500 mg twice daily.  She had her flu vaccine in October.   Past Medical/Surgical History: Past Medical History:  Diagnosis Date   Abnormal uterine bleeding    Prediabetes    Thyroid disease    Hypo    No past surgical history on file.  Social History:  reports that she has never smoked. She has never used smokeless tobacco. She reports that she does not drink alcohol and does not use drugs.  Allergies: No Known Allergies  Family History:  Family History  Problem Relation Age of Onset   Breast cancer Maternal Aunt    Cancer Maternal Grandfather    Heart disease Paternal Grandfather      Current Outpatient Medications:    atorvastatin (LIPITOR) 10 MG tablet, TAKE ONE TABLET BY MOUTH DAILY, Disp: 90 tablet, Rfl: 1   azelastine (ASTELIN) 0.1 % nasal spray, Place 2 sprays into both nostrils 2 (two) times daily. Use in each nostril as directed, Disp: 30 mL, Rfl: 3   etonogestrel-ethinyl estradiol (NUVARING) 0.12-0.015 MG/24HR vaginal ring, Place 1 ring vaginally once a week for 3 consecutive weeks, then remove for 1 week, Disp: 3 each, Rfl: 3   levothyroxine (SYNTHROID) 75 MCG tablet, TAKE ONE TABLET BY MOUTH DAILY, Disp: 90 tablet, Rfl: 1   metFORMIN (GLUCOPHAGE) 500 MG tablet, Take 1 tablet (500 mg total) by mouth 2 (two) times daily with a meal., Disp: 180 tablet, Rfl: 3   albuterol (VENTOLIN HFA) 108 (90 Base) MCG/ACT inhaler, INHALE 2 PUFFS BY MOUTH INTO THE LUNGS PRIOR TO EXERCISE, Disp: 8.5 g, Rfl: 3  Review of Systems:   Constitutional: Denies fever, chills, diaphoresis, appetite change and fatigue.  HEENT: Denies photophobia, eye pain, redness, hearing loss, ear pain, congestion, sore throat, rhinorrhea, sneezing, mouth sores, trouble swallowing, neck pain, neck stiffness and tinnitus.   Respiratory: Denies SOB, DOE, cough, chest tightness,  and wheezing.   Cardiovascular: Denies chest pain, palpitations and leg swelling.  Gastrointestinal: Denies nausea, vomiting, abdominal pain, diarrhea, constipation, blood in stool and abdominal distention.  Genitourinary: Denies dysuria, urgency, frequency, hematuria, flank pain and difficulty urinating.  Endocrine: Denies: hot or cold intolerance, sweats, changes in hair or nails, polyuria, polydipsia. Musculoskeletal: Denies myalgias, back pain, joint swelling, arthralgias and gait problem.  Skin: Denies pallor, rash and wound.  Neurological: Denies dizziness, seizures, syncope, weakness, light-headedness, numbness and headaches.  Hematological: Denies adenopathy. Easy bruising, personal or family bleeding history  Psychiatric/Behavioral: Denies suicidal ideation, mood changes, confusion, nervousness, sleep disturbance and agitation    Physical Exam: Vitals:   02/20/22 0854  BP: 110/78  Pulse: 84  Temp: 98.6 F (37 C)  TempSrc: Oral  SpO2: 96%  Weight: 214 lb (97.1 kg)  Height: 5' 4.5" (1.638 m)    Body mass index is 36.17 kg/m.   Constitutional: NAD, calm, comfortable Eyes: PERRL, lids and conjunctivae normal ENMT: Mucous membranes are moist. Posterior pharynx clear of any exudate or lesions. Normal dentition. Tympanic membrane is pearly white, no erythema or bulging. Neck: normal, supple, no masses, no thyromegaly Respiratory: clear to  auscultation bilaterally, no wheezing, no crackles. Normal respiratory effort. No accessory muscle use.  Cardiovascular: Regular rate and rhythm, no murmurs / rubs / gallops. No extremity edema. 2+ pedal pulses. No  carotid bruits.  Abdomen: no tenderness, no masses palpated. No hepatosplenomegaly. Bowel sounds positive.  Musculoskeletal: no clubbing / cyanosis. No joint deformity upper and lower extremities. Good ROM, no contractures. Normal muscle tone.  Skin: no rashes, lesions, ulcers. No induration Neurologic: CN 2-12 grossly intact. Sensation intact, DTR normal. Strength 5/5 in all 4.  Psychiatric: Normal judgment and insight. Alert and oriented x 3. Normal mood.   Flowsheet Row Office Visit from 08/15/2021 in [facility] [place]  PHQ-9 Total Score 0         Impression and Plan:  Encounter for preventive health examination  Exercise-induced asthma - Plan: albuterol (VENTOLIN HFA) 108 (90 Base) MCG/ACT inhaler, CBC with Differential/Platelet, CBC with Differential/Platelet  Other specified hypothyroidism  Morbid obesity (HCC) - Plan: Vitamin B12, metFORMIN (GLUCOPHAGE) 500 MG tablet, Vitamin B12  Hyperlipidemia, unspecified hyperlipidemia type - Plan: Lipid panel, Lipid panel  IGT (impaired glucose tolerance) - Plan: Hemoglobin A1c, TSH, TSH, Hemoglobin A1c  Vitamin D deficiency  Transaminitis - Plan: Comprehensive metabolic panel, Comprehensive metabolic panel   -Recommend routine eye and dental care. -Immunizations: She will get COVID-vaccine at pharmacy but all other immunizations are up-to-date -Healthy lifestyle discussed in detail. -Labs to be updated today. -Colon cancer screening: Commence age 73 -Breast cancer screening: Commence age 39 -Cervical cancer screening: 05/2020 with GYN -Lung cancer screening: Not applicable -Prostate cancer screening: Not applicable -DEXA: Not applicable     Warren Lindahl Philip Aspen, MD [facility]

## 2022-02-21 ENCOUNTER — Other Ambulatory Visit: Payer: Self-pay | Admitting: *Deleted

## 2022-02-21 DIAGNOSIS — R7303 Prediabetes: Secondary | ICD-10-CM

## 2022-03-02 ENCOUNTER — Other Ambulatory Visit (HOSPITAL_COMMUNITY): Payer: Self-pay

## 2022-03-02 MED ORDER — ETONOGESTREL-ETHINYL ESTRADIOL 0.12-0.015 MG/24HR VA RING
1.0000 | VAGINAL_RING | VAGINAL | 5 refills | Status: DC
  Filled 2022-03-02: qty 1, 28d supply, fill #0
  Filled 2022-03-26: qty 1, 28d supply, fill #1

## 2022-03-29 ENCOUNTER — Encounter: Payer: Self-pay | Admitting: Obstetrics and Gynecology

## 2022-03-29 NOTE — Telephone Encounter (Signed)
Yes, please send in the brand name nuvaring, #3, 1 refill

## 2022-03-29 NOTE — Telephone Encounter (Signed)
Okay to send in brand nuvaring?

## 2022-03-30 ENCOUNTER — Other Ambulatory Visit (HOSPITAL_COMMUNITY): Payer: Self-pay

## 2022-03-30 MED ORDER — NUVARING 0.12-0.015 MG/24HR VA RING
VAGINAL_RING | VAGINAL | 1 refills | Status: AC
  Filled 2022-03-30: qty 3, fill #0
  Filled 2022-04-20: qty 1, 28d supply, fill #0
  Filled 2022-05-18: qty 1, 28d supply, fill #1
  Filled 2022-06-12: qty 1, 28d supply, fill #2
  Filled 2022-07-14: qty 1, 28d supply, fill #3
  Filled 2022-08-07: qty 1, 28d supply, fill #4
  Filled 2022-09-09: qty 1, 28d supply, fill #5

## 2022-04-20 ENCOUNTER — Other Ambulatory Visit: Payer: Self-pay

## 2022-04-20 ENCOUNTER — Other Ambulatory Visit (HOSPITAL_COMMUNITY): Payer: Self-pay

## 2022-04-21 ENCOUNTER — Other Ambulatory Visit: Payer: Self-pay | Admitting: Internal Medicine

## 2022-04-21 ENCOUNTER — Other Ambulatory Visit (HOSPITAL_COMMUNITY): Payer: Self-pay

## 2022-04-21 DIAGNOSIS — E785 Hyperlipidemia, unspecified: Secondary | ICD-10-CM

## 2022-04-21 DIAGNOSIS — E038 Other specified hypothyroidism: Secondary | ICD-10-CM

## 2022-04-24 ENCOUNTER — Other Ambulatory Visit (HOSPITAL_COMMUNITY): Payer: Self-pay

## 2022-04-25 ENCOUNTER — Other Ambulatory Visit (HOSPITAL_COMMUNITY): Payer: Self-pay

## 2022-05-18 ENCOUNTER — Other Ambulatory Visit: Payer: Self-pay

## 2022-05-19 ENCOUNTER — Other Ambulatory Visit: Payer: Self-pay

## 2022-08-08 ENCOUNTER — Other Ambulatory Visit (HOSPITAL_COMMUNITY): Payer: Self-pay

## 2022-09-12 ENCOUNTER — Other Ambulatory Visit: Payer: Self-pay

## 2022-09-12 ENCOUNTER — Other Ambulatory Visit (HOSPITAL_COMMUNITY): Payer: Self-pay

## 2022-09-22 ENCOUNTER — Other Ambulatory Visit (HOSPITAL_COMMUNITY): Payer: Self-pay

## 2022-09-26 ENCOUNTER — Other Ambulatory Visit (HOSPITAL_COMMUNITY): Payer: Self-pay

## 2022-10-02 ENCOUNTER — Other Ambulatory Visit (HOSPITAL_COMMUNITY): Payer: Self-pay

## 2022-10-11 ENCOUNTER — Other Ambulatory Visit (HOSPITAL_COMMUNITY): Payer: Self-pay

## 2022-10-24 ENCOUNTER — Other Ambulatory Visit: Payer: Self-pay | Admitting: Internal Medicine

## 2022-10-24 DIAGNOSIS — E038 Other specified hypothyroidism: Secondary | ICD-10-CM
